# Patient Record
Sex: Female | Born: 1935
Health system: Southern US, Community
[De-identification: ages and names within clinical notes are randomized; demographics above are authoritative.]

## PROBLEM LIST (undated history)

## (undated) DIAGNOSIS — K5790 Diverticulosis of intestine, part unspecified, without perforation or abscess without bleeding: Secondary | ICD-10-CM

## (undated) DIAGNOSIS — F0390 Unspecified dementia without behavioral disturbance: Secondary | ICD-10-CM

## (undated) DIAGNOSIS — I10 Essential (primary) hypertension: Secondary | ICD-10-CM

## (undated) DIAGNOSIS — F419 Anxiety disorder, unspecified: Secondary | ICD-10-CM

## (undated) DIAGNOSIS — M1990 Unspecified osteoarthritis, unspecified site: Secondary | ICD-10-CM

## (undated) HISTORY — DX: Essential (primary) hypertension: I10

## (undated) HISTORY — DX: Diverticulosis of intestine, part unspecified, without perforation or abscess without bleeding: K57.90

## (undated) HISTORY — PX: ABDOMINAL SURGERY: SHX537

## (undated) HISTORY — DX: Anxiety disorder, unspecified: F41.9

## (undated) HISTORY — DX: Unspecified dementia, unspecified severity, without behavioral disturbance, psychotic disturbance, mood disturbance, and anxiety: F03.90

## (undated) HISTORY — DX: Unspecified osteoarthritis, unspecified site: M19.90

## (undated) HISTORY — PX: ABDOMINAL HYSTERECTOMY: SHX81

---

## 2001-03-09 ENCOUNTER — Encounter: Payer: Self-pay | Admitting: Internal Medicine

## 2001-03-09 ENCOUNTER — Inpatient Hospital Stay (HOSPITAL_COMMUNITY): Admission: EM | Admit: 2001-03-09 | Discharge: 2001-03-14 | Payer: Self-pay | Admitting: Emergency Medicine

## 2001-03-09 ENCOUNTER — Emergency Department (HOSPITAL_COMMUNITY): Admission: EM | Admit: 2001-03-09 | Discharge: 2001-03-09 | Payer: Self-pay | Admitting: Internal Medicine

## 2001-03-10 ENCOUNTER — Encounter: Payer: Self-pay | Admitting: Internal Medicine

## 2001-05-13 ENCOUNTER — Ambulatory Visit (HOSPITAL_COMMUNITY): Admission: RE | Admit: 2001-05-13 | Discharge: 2001-05-13 | Payer: Self-pay | Admitting: Ophthalmology

## 2002-05-26 ENCOUNTER — Encounter: Payer: Self-pay | Admitting: Family Medicine

## 2002-05-26 ENCOUNTER — Inpatient Hospital Stay (HOSPITAL_COMMUNITY): Admission: RE | Admit: 2002-05-26 | Discharge: 2002-06-03 | Payer: Self-pay | Admitting: Family Medicine

## 2002-05-28 ENCOUNTER — Encounter: Payer: Self-pay | Admitting: General Surgery

## 2002-09-26 ENCOUNTER — Encounter: Payer: Self-pay | Admitting: Emergency Medicine

## 2002-09-26 ENCOUNTER — Inpatient Hospital Stay (HOSPITAL_COMMUNITY): Admission: EM | Admit: 2002-09-26 | Discharge: 2002-09-28 | Payer: Self-pay | Admitting: Emergency Medicine

## 2002-09-28 ENCOUNTER — Encounter: Payer: Self-pay | Admitting: General Surgery

## 2004-02-02 ENCOUNTER — Emergency Department (HOSPITAL_COMMUNITY): Admission: EM | Admit: 2004-02-02 | Discharge: 2004-02-02 | Payer: Self-pay | Admitting: Emergency Medicine

## 2004-05-19 ENCOUNTER — Emergency Department (HOSPITAL_COMMUNITY): Admission: EM | Admit: 2004-05-19 | Discharge: 2004-05-19 | Payer: Self-pay | Admitting: Emergency Medicine

## 2004-09-25 ENCOUNTER — Ambulatory Visit: Payer: Self-pay | Admitting: Family Medicine

## 2004-12-26 ENCOUNTER — Ambulatory Visit: Payer: Self-pay | Admitting: Family Medicine

## 2005-02-06 ENCOUNTER — Ambulatory Visit: Payer: Self-pay | Admitting: Family Medicine

## 2005-04-10 ENCOUNTER — Ambulatory Visit: Payer: Self-pay | Admitting: Family Medicine

## 2005-06-08 ENCOUNTER — Ambulatory Visit: Payer: Self-pay | Admitting: Family Medicine

## 2005-07-27 ENCOUNTER — Ambulatory Visit: Payer: Self-pay | Admitting: Family Medicine

## 2005-09-18 ENCOUNTER — Ambulatory Visit: Payer: Self-pay | Admitting: Family Medicine

## 2005-10-31 ENCOUNTER — Ambulatory Visit: Payer: Self-pay | Admitting: Family Medicine

## 2005-12-11 ENCOUNTER — Ambulatory Visit: Payer: Self-pay | Admitting: Family Medicine

## 2006-01-09 ENCOUNTER — Ambulatory Visit: Payer: Self-pay | Admitting: Family Medicine

## 2006-02-25 ENCOUNTER — Ambulatory Visit: Payer: Self-pay | Admitting: Family Medicine

## 2006-04-26 ENCOUNTER — Ambulatory Visit: Payer: Self-pay | Admitting: Family Medicine

## 2006-05-05 ENCOUNTER — Inpatient Hospital Stay (HOSPITAL_COMMUNITY): Admission: EM | Admit: 2006-05-05 | Discharge: 2006-05-14 | Payer: Self-pay | Admitting: Emergency Medicine

## 2006-05-30 ENCOUNTER — Ambulatory Visit: Payer: Self-pay | Admitting: Family Medicine

## 2006-07-04 ENCOUNTER — Ambulatory Visit: Payer: Self-pay | Admitting: Family Medicine

## 2006-07-29 ENCOUNTER — Ambulatory Visit: Payer: Self-pay | Admitting: Family Medicine

## 2006-08-15 ENCOUNTER — Ambulatory Visit: Payer: Self-pay | Admitting: Family Medicine

## 2006-08-26 ENCOUNTER — Ambulatory Visit: Payer: Self-pay | Admitting: Family Medicine

## 2006-10-02 ENCOUNTER — Inpatient Hospital Stay (HOSPITAL_COMMUNITY): Admission: EM | Admit: 2006-10-02 | Discharge: 2006-10-04 | Payer: Self-pay | Admitting: Emergency Medicine

## 2006-10-02 ENCOUNTER — Ambulatory Visit: Payer: Self-pay | Admitting: Internal Medicine

## 2006-10-10 ENCOUNTER — Ambulatory Visit: Payer: Self-pay | Admitting: Family Medicine

## 2006-10-15 ENCOUNTER — Ambulatory Visit: Payer: Self-pay | Admitting: Family Medicine

## 2006-11-15 ENCOUNTER — Ambulatory Visit: Payer: Self-pay | Admitting: Family Medicine

## 2006-12-06 ENCOUNTER — Ambulatory Visit: Payer: Self-pay | Admitting: Family Medicine

## 2006-12-06 LAB — CONVERTED CEMR LAB
AST: 52 units/L — ABNORMAL HIGH (ref 0–37)
Albumin: 4.2 g/dL (ref 3.5–5.2)
Alkaline Phosphatase: 130 units/L — ABNORMAL HIGH (ref 39–117)
BUN: 27 mg/dL — ABNORMAL HIGH (ref 6–23)
Basophils Relative: 1 % (ref 0–1)
Eosinophils Absolute: 0 10*3/uL (ref 0.0–0.7)
Eosinophils Relative: 0 % (ref 0–5)
Glucose, Bld: 92 mg/dL (ref 70–99)
HCT: 32.5 % — ABNORMAL LOW (ref 36.0–46.0)
Lymphs Abs: 1.5 10*3/uL (ref 0.7–3.3)
MCHC: 32.6 g/dL (ref 30.0–36.0)
MCV: 76.8 fL — ABNORMAL LOW (ref 78.0–100.0)
Neutrophils Relative %: 61 % (ref 43–77)
Platelets: 253 10*3/uL (ref 150–400)
Potassium: 5.1 meq/L (ref 3.5–5.3)
Sodium: 144 meq/L (ref 135–145)
Total Bilirubin: 0.9 mg/dL (ref 0.3–1.2)

## 2006-12-09 ENCOUNTER — Encounter: Payer: Self-pay | Admitting: Family Medicine

## 2006-12-09 LAB — CONVERTED CEMR LAB
ALT: 42 units/L — ABNORMAL HIGH (ref 0–35)
AST: 54 units/L — ABNORMAL HIGH (ref 0–37)
Bilirubin, Direct: 0.1 mg/dL (ref 0.0–0.3)
Indirect Bilirubin: 0.7 mg/dL (ref 0.0–0.9)
Total Protein: 7.1 g/dL (ref 6.0–8.3)

## 2006-12-27 ENCOUNTER — Inpatient Hospital Stay (HOSPITAL_COMMUNITY): Admission: EM | Admit: 2006-12-27 | Discharge: 2007-01-01 | Payer: Self-pay | Admitting: Emergency Medicine

## 2007-01-15 ENCOUNTER — Ambulatory Visit: Payer: Self-pay | Admitting: Family Medicine

## 2007-03-03 ENCOUNTER — Ambulatory Visit: Payer: Self-pay | Admitting: Family Medicine

## 2007-03-06 ENCOUNTER — Ambulatory Visit: Payer: Self-pay | Admitting: Family Medicine

## 2007-04-25 ENCOUNTER — Emergency Department (HOSPITAL_COMMUNITY): Admission: EM | Admit: 2007-04-25 | Discharge: 2007-04-25 | Payer: Self-pay | Admitting: Emergency Medicine

## 2007-05-27 ENCOUNTER — Ambulatory Visit: Payer: Self-pay | Admitting: Family Medicine

## 2007-05-27 LAB — CONVERTED CEMR LAB
Basophils Absolute: 0 10*3/uL (ref 0.0–0.1)
Basophils Relative: 1 % (ref 0–1)
CO2: 24 meq/L (ref 19–32)
Calcium: 9.4 mg/dL (ref 8.4–10.5)
Cholesterol: 194 mg/dL (ref 0–200)
Creatinine, Ser: 1.42 mg/dL — ABNORMAL HIGH (ref 0.40–1.20)
Eosinophils Absolute: 0.1 10*3/uL (ref 0.0–0.7)
Eosinophils Relative: 1 % (ref 0–5)
HCT: 34.7 % — ABNORMAL LOW (ref 36.0–46.0)
HDL: 71 mg/dL (ref >39)
Hemoglobin: 11.4 g/dL — ABNORMAL LOW (ref 12.0–15.0)
Iron: 63 ug/dL (ref 42–145)
Lymphocytes Relative: 29 % (ref 12–46)
MCHC: 32.9 g/dL (ref 30.0–36.0)
MCV: 78.2 fL (ref 78.0–100.0)
Monocytes Absolute: 0.3 10*3/uL (ref 0.2–0.7)
Platelets: 224 10*3/uL (ref 150–400)
RDW: 17.4 % — ABNORMAL HIGH (ref 11.5–14.0)
Retic Count, Absolute: 35.5 (ref 19.0–186.0)
Retic Ct Pct: 0.8 % (ref 0.4–3.1)
Saturation Ratios: 16 % — ABNORMAL LOW (ref 20–55)
TIBC: 387 ug/dL (ref 250–470)
UIBC: 324 ug/dL

## 2007-06-19 ENCOUNTER — Ambulatory Visit: Payer: Self-pay | Admitting: Family Medicine

## 2007-07-17 ENCOUNTER — Ambulatory Visit: Payer: Self-pay | Admitting: Family Medicine

## 2007-08-14 ENCOUNTER — Ambulatory Visit: Payer: Self-pay | Admitting: Family Medicine

## 2007-09-04 ENCOUNTER — Emergency Department (HOSPITAL_COMMUNITY): Admission: EM | Admit: 2007-09-04 | Discharge: 2007-09-04 | Payer: Self-pay | Admitting: Emergency Medicine

## 2007-09-12 ENCOUNTER — Ambulatory Visit: Payer: Self-pay | Admitting: Family Medicine

## 2007-09-12 ENCOUNTER — Observation Stay (HOSPITAL_COMMUNITY): Admission: EM | Admit: 2007-09-12 | Discharge: 2007-09-13 | Payer: Self-pay | Admitting: Emergency Medicine

## 2007-09-19 ENCOUNTER — Ambulatory Visit: Payer: Self-pay | Admitting: Family Medicine

## 2007-09-25 ENCOUNTER — Encounter: Payer: Self-pay | Admitting: Family Medicine

## 2007-10-16 ENCOUNTER — Ambulatory Visit: Payer: Self-pay | Admitting: Family Medicine

## 2007-10-31 ENCOUNTER — Ambulatory Visit: Payer: Self-pay | Admitting: Family Medicine

## 2007-11-13 ENCOUNTER — Encounter: Payer: Self-pay | Admitting: Family Medicine

## 2007-12-15 ENCOUNTER — Ambulatory Visit: Payer: Self-pay | Admitting: Family Medicine

## 2007-12-16 ENCOUNTER — Ambulatory Visit: Payer: Self-pay | Admitting: Family Medicine

## 2008-01-30 ENCOUNTER — Ambulatory Visit: Payer: Self-pay | Admitting: Family Medicine

## 2008-01-30 LAB — CONVERTED CEMR LAB
BUN: 27 mg/dL — ABNORMAL HIGH (ref 6–23)
Basophils Relative: 1 % (ref 0–1)
Calcium: 9.1 mg/dL (ref 8.4–10.5)
Eosinophils Absolute: 0.1 10*3/uL (ref 0.0–0.7)
Eosinophils Relative: 1 % (ref 0–5)
Glucose, Bld: 101 mg/dL — ABNORMAL HIGH (ref 70–99)
HCT: 37.8 % (ref 36.0–46.0)
Lymphs Abs: 1.1 10*3/uL (ref 0.7–4.0)
MCHC: 35.2 g/dL (ref 30.0–36.0)
MCV: 86.5 fL (ref 78.0–100.0)
Monocytes Relative: 8 % (ref 3–12)
Platelets: 169 10*3/uL (ref 150–400)
Sodium: 142 meq/L (ref 135–145)
WBC: 5.4 10*3/uL (ref 4.0–10.5)

## 2008-02-03 ENCOUNTER — Encounter: Payer: Self-pay | Admitting: Family Medicine

## 2008-02-03 LAB — CONVERTED CEMR LAB: Uric Acid, Serum: 7.5 mg/dL — ABNORMAL HIGH (ref 2.4–7.0)

## 2008-02-06 ENCOUNTER — Ambulatory Visit: Payer: Self-pay | Admitting: Family Medicine

## 2008-03-10 DIAGNOSIS — F0390 Unspecified dementia without behavioral disturbance: Secondary | ICD-10-CM

## 2008-03-10 DIAGNOSIS — F419 Anxiety disorder, unspecified: Secondary | ICD-10-CM

## 2008-03-10 DIAGNOSIS — K5730 Diverticulosis of large intestine without perforation or abscess without bleeding: Secondary | ICD-10-CM

## 2008-03-10 DIAGNOSIS — M479 Spondylosis, unspecified: Secondary | ICD-10-CM | POA: Insufficient documentation

## 2008-03-10 DIAGNOSIS — I10 Essential (primary) hypertension: Secondary | ICD-10-CM

## 2008-03-11 ENCOUNTER — Ambulatory Visit: Payer: Self-pay | Admitting: Family Medicine

## 2008-04-14 ENCOUNTER — Ambulatory Visit: Payer: Self-pay | Admitting: Family Medicine

## 2008-05-06 ENCOUNTER — Ambulatory Visit: Payer: Self-pay | Admitting: Family Medicine

## 2008-06-10 ENCOUNTER — Ambulatory Visit: Payer: Self-pay | Admitting: Family Medicine

## 2008-08-13 ENCOUNTER — Ambulatory Visit: Payer: Self-pay | Admitting: Family Medicine

## 2008-08-16 ENCOUNTER — Ambulatory Visit: Payer: Self-pay | Admitting: Family Medicine

## 2008-08-27 ENCOUNTER — Encounter: Payer: Self-pay | Admitting: Family Medicine

## 2008-08-30 ENCOUNTER — Encounter: Payer: Self-pay | Admitting: Family Medicine

## 2008-08-31 ENCOUNTER — Encounter: Payer: Self-pay | Admitting: Family Medicine

## 2008-09-04 ENCOUNTER — Telehealth (INDEPENDENT_AMBULATORY_CARE_PROVIDER_SITE_OTHER): Payer: Self-pay | Admitting: Internal Medicine

## 2008-09-27 ENCOUNTER — Encounter: Payer: Self-pay | Admitting: Family Medicine

## 2008-09-29 ENCOUNTER — Ambulatory Visit: Payer: Self-pay | Admitting: Family Medicine

## 2008-10-15 ENCOUNTER — Encounter: Payer: Self-pay | Admitting: Family Medicine

## 2008-10-20 ENCOUNTER — Telehealth: Payer: Self-pay | Admitting: Family Medicine

## 2008-10-21 ENCOUNTER — Encounter: Payer: Self-pay | Admitting: Family Medicine

## 2008-10-26 ENCOUNTER — Ambulatory Visit: Payer: Self-pay | Admitting: Family Medicine

## 2008-10-27 DIAGNOSIS — N750 Cyst of Bartholin's gland: Secondary | ICD-10-CM

## 2008-11-10 ENCOUNTER — Telehealth: Payer: Self-pay | Admitting: Family Medicine

## 2008-11-10 ENCOUNTER — Encounter: Payer: Self-pay | Admitting: Family Medicine

## 2008-12-15 ENCOUNTER — Encounter: Payer: Self-pay | Admitting: Family Medicine

## 2008-12-16 ENCOUNTER — Encounter: Payer: Self-pay | Admitting: Family Medicine

## 2008-12-23 ENCOUNTER — Encounter: Payer: Self-pay | Admitting: Family Medicine

## 2008-12-29 ENCOUNTER — Telehealth: Payer: Self-pay | Admitting: Family Medicine

## 2009-01-03 ENCOUNTER — Ambulatory Visit: Payer: Self-pay | Admitting: Family Medicine

## 2009-01-03 LAB — CONVERTED CEMR LAB
Basophils Absolute: 0 10*3/uL (ref 0.0–0.1)
Basophils Relative: 0 % (ref 0–1)
Chloride: 105 meq/L (ref 96–112)
Creatinine, Ser: 1.51 mg/dL — ABNORMAL HIGH (ref 0.40–1.20)
HDL: 56 mg/dL (ref >39)
LDL Cholesterol: 109 mg/dL — ABNORMAL HIGH (ref 0–99)
MCHC: 33.4 g/dL (ref 30.0–36.0)
Neutro Abs: 3.1 10*3/uL (ref 1.7–7.7)
Neutrophils Relative %: 63 % (ref 43–77)
Platelets: 254 10*3/uL (ref 150–400)
Potassium: 4.5 meq/L (ref 3.5–5.3)
RDW: 14.1 % (ref 11.5–15.5)
Sodium: 143 meq/L (ref 135–145)
Triglycerides: 94 mg/dL (ref <150)
VLDL: 19 mg/dL (ref 0–40)

## 2009-03-01 ENCOUNTER — Encounter: Payer: Self-pay | Admitting: Family Medicine

## 2009-03-04 ENCOUNTER — Encounter: Payer: Self-pay | Admitting: Family Medicine

## 2009-03-07 ENCOUNTER — Ambulatory Visit: Payer: Self-pay | Admitting: Family Medicine

## 2009-04-05 ENCOUNTER — Encounter: Payer: Self-pay | Admitting: Family Medicine

## 2009-04-26 ENCOUNTER — Encounter: Payer: Self-pay | Admitting: Family Medicine

## 2009-05-06 ENCOUNTER — Encounter: Payer: Self-pay | Admitting: Family Medicine

## 2009-05-09 ENCOUNTER — Ambulatory Visit: Payer: Self-pay | Admitting: Family Medicine

## 2009-05-09 ENCOUNTER — Telehealth: Payer: Self-pay | Admitting: Family Medicine

## 2009-05-09 DIAGNOSIS — B351 Tinea unguium: Secondary | ICD-10-CM | POA: Insufficient documentation

## 2009-05-11 ENCOUNTER — Encounter: Payer: Self-pay | Admitting: Family Medicine

## 2009-06-06 ENCOUNTER — Encounter: Payer: Self-pay | Admitting: Family Medicine

## 2009-06-13 ENCOUNTER — Encounter: Payer: Self-pay | Admitting: Family Medicine

## 2009-06-20 ENCOUNTER — Encounter: Payer: Self-pay | Admitting: Family Medicine

## 2009-08-02 ENCOUNTER — Encounter: Payer: Self-pay | Admitting: Family Medicine

## 2009-08-17 ENCOUNTER — Ambulatory Visit: Payer: Self-pay | Admitting: Family Medicine

## 2009-09-01 ENCOUNTER — Encounter: Payer: Self-pay | Admitting: Family Medicine

## 2009-09-09 ENCOUNTER — Telehealth: Payer: Self-pay | Admitting: Family Medicine

## 2009-10-11 ENCOUNTER — Encounter: Payer: Self-pay | Admitting: Family Medicine

## 2009-10-19 ENCOUNTER — Encounter: Payer: Self-pay | Admitting: Family Medicine

## 2009-10-21 ENCOUNTER — Encounter: Payer: Self-pay | Admitting: Family Medicine

## 2009-12-06 ENCOUNTER — Ambulatory Visit: Payer: Self-pay | Admitting: Family Medicine

## 2009-12-21 ENCOUNTER — Ambulatory Visit: Payer: Self-pay | Admitting: Family Medicine

## 2009-12-21 LAB — CONVERTED CEMR LAB
BUN: 27 mg/dL — ABNORMAL HIGH (ref 6–23)
Basophils Absolute: 0 10*3/uL (ref 0.0–0.1)
Calcium: 9.5 mg/dL (ref 8.4–10.5)
Cholesterol: 221 mg/dL — ABNORMAL HIGH (ref 0–200)
Creatinine, Ser: 1.31 mg/dL — ABNORMAL HIGH (ref 0.40–1.20)
Eosinophils Relative: 2 % (ref 0–5)
HCT: 40.8 % (ref 36.0–46.0)
Lymphocytes Relative: 27 % (ref 12–46)
Platelets: 177 10*3/uL (ref 150–400)
Potassium: 4.7 meq/L (ref 3.5–5.3)
RDW: 14.6 % (ref 11.5–15.5)
Triglycerides: 81 mg/dL (ref <150)
VLDL: 16 mg/dL (ref 0–40)

## 2010-01-03 ENCOUNTER — Encounter: Payer: Self-pay | Admitting: Family Medicine

## 2010-01-05 ENCOUNTER — Ambulatory Visit: Payer: Self-pay | Admitting: Family Medicine

## 2010-01-05 ENCOUNTER — Telehealth: Payer: Self-pay | Admitting: Family Medicine

## 2010-01-26 ENCOUNTER — Telehealth: Payer: Self-pay | Admitting: Family Medicine

## 2010-01-31 ENCOUNTER — Ambulatory Visit: Payer: Self-pay | Admitting: Family Medicine

## 2010-01-31 DIAGNOSIS — R42 Dizziness and giddiness: Secondary | ICD-10-CM

## 2010-02-06 ENCOUNTER — Encounter: Payer: Self-pay | Admitting: Family Medicine

## 2010-02-08 ENCOUNTER — Encounter: Payer: Self-pay | Admitting: Family Medicine

## 2010-02-20 ENCOUNTER — Encounter: Payer: Self-pay | Admitting: Family Medicine

## 2010-02-24 ENCOUNTER — Encounter: Payer: Self-pay | Admitting: Family Medicine

## 2010-04-06 ENCOUNTER — Encounter: Payer: Self-pay | Admitting: Family Medicine

## 2010-04-26 ENCOUNTER — Ambulatory Visit: Payer: Self-pay | Admitting: Family Medicine

## 2010-04-26 DIAGNOSIS — R634 Abnormal weight loss: Secondary | ICD-10-CM

## 2010-05-09 ENCOUNTER — Encounter: Payer: Self-pay | Admitting: Family Medicine

## 2010-05-09 ENCOUNTER — Telehealth: Payer: Self-pay | Admitting: Family Medicine

## 2010-05-29 ENCOUNTER — Ambulatory Visit: Payer: Self-pay | Admitting: Family Medicine

## 2010-06-02 ENCOUNTER — Ambulatory Visit: Payer: Self-pay | Admitting: Family Medicine

## 2010-06-13 ENCOUNTER — Encounter: Payer: Self-pay | Admitting: Family Medicine

## 2010-07-05 ENCOUNTER — Telehealth: Payer: Self-pay | Admitting: Family Medicine

## 2010-08-01 ENCOUNTER — Ambulatory Visit: Payer: Self-pay | Admitting: Family Medicine

## 2010-08-14 ENCOUNTER — Encounter: Payer: Self-pay | Admitting: Family Medicine

## 2010-08-18 ENCOUNTER — Telehealth: Payer: Self-pay | Admitting: Family Medicine

## 2010-09-18 ENCOUNTER — Telehealth: Payer: Self-pay | Admitting: Family Medicine

## 2010-09-21 ENCOUNTER — Ambulatory Visit: Payer: Self-pay | Admitting: Family Medicine

## 2010-10-10 ENCOUNTER — Ambulatory Visit: Payer: Self-pay | Admitting: Family Medicine

## 2010-11-16 ENCOUNTER — Telehealth: Payer: Self-pay | Admitting: Family Medicine

## 2010-11-29 ENCOUNTER — Encounter: Payer: Self-pay | Admitting: Family Medicine

## 2010-12-12 NOTE — Letter (Signed)
Summary: progress notes  progress notes   Imported By: Curtis Sites 04/13/2010 12:05:49  _____________________________________________________________________  External Attachment:    Type:   Image     Comment:   External Document

## 2010-12-12 NOTE — Letter (Signed)
Summary: history and physical  history and physical   Imported By: Curtis Sites 04/13/2010 12:06:55  _____________________________________________________________________  External Attachment:    Type:   Image     Comment:   External Document

## 2010-12-12 NOTE — Assessment & Plan Note (Signed)
Summary: recert   Allergies: No Known Drug Allergies   Complete Medication List: 1)  Stool Softener 100 Mg Caps (Docusate sodium) .... One cap by mouth two times a day 2)  Amlodipine Besy-benazepril Hcl 10-20 Mg Caps (Amlodipine besy-benazepril hcl) .... One cap by mouth once daily 3)  Abc Plus Tabs (Multiple vitamins-minerals) .... One tab by mouth once daily 4)  Ted Hose (knee High)  5)  [employer] (Misc. devices) .... Uad 6)  Benicar Hct 40-25 Mg Tabs (Olmesartan medoxomil-hctz) .... Take 1 tablet by mouth once a day 7)  Alprazolam 0.25 Mg Tabs (Alprazolam) .... May take one at bedtime as needed for anxiety 8)  Ferocon Caps (Fe fumarate-b12-vit c-fa-ifc) .... Take 1 tablet by mouth once a day  Other Orders: Re-certification of Home Health [phone])

## 2010-12-12 NOTE — Assessment & Plan Note (Signed)
Summary: office visit   Vital Signs:  Patient profile:   77 year old female Menstrual status:  hysterectomy Height:      62 inches O2 Sat:      98 % on Room air Pulse rate:   66 / minute Pulse rhythm:   regular Resp:     16 per minute BP sitting:   158 / 70  (left arm) Cuff size:   regular  Vitals Entered By: Everitt Amber LPN (May 29, 2010 9:41 AM)  O2 Flow:  Room air CC: Follow up chronic problems   Primary Care Provider:  Syliva Overman MD  CC:  Follow up chronic problems.  History of Present Illness: Reports  that she has been  doing fairly well. Denies recent fever or chills. Denies sinus pressure, nasal congestion , ear pain or sore throat. Denies chest congestion, or cough productive of sputum. Denies chest pain, palpitations, PND, orthopnea or leg swelling. Denies abdominal pain, nausea, vomitting, diarrhea or constipation.she had a recent episode of rectal bleeding , however she remained stable and was not hospitalised, review of the labs shows nl hemoglobin Denies change in bowel movements  Denies dysuria , frequency, incontinence or hesitancy. chronic joint pain and stiffness with reduced mobility.Wheelchair dependent Denies headaches, vertigo, seizures. Denies depression, anxiety or insomnia.She takes med for anxiety and insomnia which controls her symptoms. Denies  rash, lesions, or itch.     Current Medications (verified): 1)  Stool Softener 100 Mg  Caps (Docusate Sodium) .... One Cap By Mouth Two Times A Day 2)  Amlodipine Besy-Benazepril Hcl 10-20 Mg  Caps (Amlodipine Besy-Benazepril Hcl) .... One Cap By Mouth Once Daily 3)  Abc Plus   Tabs (Multiple Vitamins-Minerals) .... One Tab By Mouth Once Daily 4)  Ted Hose (Knee High) 5)  Geophysical data processor (Misc. Devices) .... Uad 6)  Diovan Hct 320-25 Mg Tabs (Valsartan-Hydrochlorothiazide) .... Take 1 Daily 7)  Alprazolam 0.25 Mg Tabs (Alprazolam) .... May Take One At Bedtime As Needed For Anxiety 8)  Ferocon   Caps (Fe Fumarate-B12-Vit C-Fa-Ifc) .... Take 1 Tablet By Mouth Once A Day  Allergies (verified): No Known Drug Allergies  Review of Systems      See HPI General:  Complains of fatigue. Eyes:  Complains of vision loss-both eyes. Heme:  Denies abnormal bruising and bleeding. Allergy:  Denies hives or rash, itching eyes, and seasonal allergies.  Physical Exam  General:  Well-developed,well-nourished,in no acute distress; alert,appropriate and cooperative throughout examination HEENT: No facial asymmetry,  EOMI, No sinus tenderness, TM's Clear, oropharynx  pink and moist.   Chest: Clear to auscultation bilaterally.  CVS: S1, S2, No murmurs, No S3.   Abd: Soft, Nontender.  JY:[id number]  ROM spine, hips, shoulders and knees.  Ext: No edema.   CNS: CN 2-12 intact, power tone and sensation normal throughout.   Skin: Intact, no visible lesions or rashes.  Psych: Good eye contact, normal affect.  Memory loss, not anxious or depressed appearing.    Impression & Recommendations:  Problem # 1:  DIZZINESS (ICD-780.4) Assessment Improved  Problem # 2:  DEMENTIA (ICD-294.8) Assessment: Unchanged  Problem # 3:  DEGENERATIVE JOINT DISEASE, SPINE (ICD-721.90) Assessment: Unchanged  Problem # 4:  HYPERTENSION (ICD-401.9) Assessment: Unchanged  Her updated medication list for this problem includes:    Amlodipine Besy-benazepril Hcl 10-20 Mg Caps (Amlodipine besy-benazepril hcl) ..... One cap by mouth once daily    Diovan Hct 320-25 Mg Tabs (Valsartan-hydrochlorothiazide) .Marland Kitchen... Take 1 daily  BP  today: 158/70 Prior BP: 162/92 (04/26/2010)  Labs Reviewed: K+: 4.7 (12/21/2009) Creat: : 1.31 (12/21/2009)   Chol: 221 (12/21/2009)   HDL: 76 (12/21/2009)   LDL: 129 (12/21/2009)   TG: 81 (12/21/2009)  Complete Medication List: 1)  Stool Softener 100 Mg Caps (Docusate sodium) .... One cap by mouth two times a day 2)  Amlodipine Besy-benazepril Hcl 10-20 Mg Caps (Amlodipine besy-benazepril  hcl) .... One cap by mouth once daily 3)  Abc Plus Tabs (Multiple vitamins-minerals) .... One tab by mouth once daily 4)  Ted Hose (knee High)  5)  [employer] (Misc. devices) .... Uad 6)  Diovan Hct 320-25 Mg Tabs (Valsartan-hydrochlorothiazide) .... Take 1 daily 7)  Alprazolam 0.25 Mg Tabs (Alprazolam) .... May take one at bedtime as needed for anxiety 8)  Ferocon Caps (Fe fumarate-b12-vit c-fa-ifc) .... Take 1 tablet by mouth once a day  Patient Instructions: 1)  Please schedule a follow-up appointment in 3.5 months. 2)  I hope you continue to do very well. 3)  No med changes at this time

## 2010-12-12 NOTE — Progress Notes (Signed)
  Phone Note Call from Patient   Caller: lydia Summary of Call: lydia called states patient complains of shortness of breath, upper and lower lungs didnt sound like adequate airflow, no wheezing, all vitals fine, no fever, doesnt seem to be in respiratory distress but lungs didnt sound good, was advised she needed to be checked out by physician, patient states she didnt have transportation and refused ambulance transport. she did say she would have someone take her to er later. Initial call taken by: Adella Hare LPN,  January 05, 2010 4:09 PM  Follow-up for Phone Call        noted, I agree with recommendation by nursing and note pt's refusal to seek immediate eval, unable to force her to do so, hopefully she will be evaluate  later as planned Follow-up by: Syliva Overman MD,  January 05, 2010 8:35 PM

## 2010-12-12 NOTE — Miscellaneous (Signed)
Summary: Home Care Report  Home Care Report   Imported By: Lind Guest 06/02/2010 15:16:43  _____________________________________________________________________  External Attachment:    Type:   Image     Comment:   External Document

## 2010-12-12 NOTE — Letter (Signed)
Summary: demographic  demographic   Imported By: Curtis Sites 04/13/2010 12:11:46  _____________________________________________________________________  External Attachment:    Type:   Image     Comment:   External Document

## 2010-12-12 NOTE — Assessment & Plan Note (Signed)
Summary: office visit   Vital Signs:  Patient profile:   77 year old female Menstrual status:  hysterectomy Pulse rate:   66 / minute Pulse rhythm:   regular BP sitting:   148 / 84  (left arm)  Vitals Entered By: Mauricia Area CMA (September 21, 2010 11:02 AM) 1CC: follow up   Primary Care Provider:  Syliva Overman MD  CC:  follow up.  History of Present Illness: Reports  that she is doing verywell. States she is not sick, and she just celebrated her 69 birthday.l. Denies recent fever or chills. Denies sinus pressure, nasal congestion , ear pain or sore throat. Denies chest congestion, or cough productive of sputum. Denies chest pain, palpitations, PND, orthopnea or leg swelling. Denies abdominal pain, nausea, vomitting, diarrhea or constipation. Denies change in bowel movements or bloody stool. Denies dysuria , frequency, incontinence or hesitancy. chronic   joint pain, swelling, and reduced mobility. Denies headaches, vertigo, seizures. Denies depression, anxiety or insomnia. Denies  rash, lesions, or itch.     Current Medications (verified): 1)  Stool Softener 100 Mg  Caps (Docusate Sodium) .... One Cap By Mouth Two Times A Day 2)  Amlodipine Besy-Benazepril Hcl 10-20 Mg  Caps (Amlodipine Besy-Benazepril Hcl) .... One Cap By Mouth Once Daily 3)  Abc Plus   Tabs (Multiple Vitamins-Minerals) .... One Tab By Mouth Once Daily 4)  Ted Hose (Knee High) 5)  Geophysical data processor (Misc. Devices) .... Uad 6)  Ferocon  Caps (Fe Fumarate-B12-Vit C-Fa-Ifc) .... Take 1 Tablet By Mouth Once A Day  Allergies (verified): No Known Drug Allergies  Review of Systems      See HPI General:  Complains of fatigue and sleep disorder. Eyes:  Complains of vision loss-both eyes; denies discharge and red eye. Neuro:  Complains of falling down, memory loss, visual disturbances, and weakness. Endo:  Denies cold intolerance, excessive thirst, excessive urination, and heat intolerance. Heme:   Denies abnormal bruising and bleeding. Allergy:  Denies hives or rash and itching eyes.  Physical Exam  General:  Well-developed,well-nourished,in no acute distress; alert,appropriate and cooperative throughout examination HEENT: No facial asymmetry,  EOMI, No sinus tenderness, TM's Clear, oropharynx  pink and moist.   Chest: Clear to auscultation bilaterally.  CVS: S1, S2, No murmurs, No S3.   Abd: Soft, Nontender.  WJ:[id number]  ROM spine, hips, shoulders and knees.  Ext: No edema.   CNS: CN 2-12 intact, power tone and sensation normal throughout.   Skin: Intact, no visible lesions or rashes.  Psych: Good eye contact, normal affect.  Memory loss, not anxious or depressed appearing.    Impression & Recommendations:  Problem # 1:  ANXIETY (ICD-300.00) Assessment Improved  The following medications were removed from the medication list:    Alprazolam 0.25 Mg Tabs (Alprazolam) ..... May take one at bedtime as needed for anxiety  Problem # 2:  DEGENERATIVE JOINT DISEASE, SPINE (ICD-721.90) Assessment: Unchanged  Problem # 3:  HYPERTENSION (ICD-401.9) Assessment: Unchanged  The following medications were removed from the medication list:    Benicar Hct 40-25 Mg Tabs (Olmesartan medoxomil-hctz) .Marland Kitchen... Take 1 tablet by mouth once a day Her updated medication list for this problem includes:    Amlodipine Besy-benazepril Hcl 10-20 Mg Caps (Amlodipine besy-benazepril hcl) ..... One cap by mouth once daily  Orders: Medicare Electronic Prescription [phone])  BP today: 148/84 Prior BP: 158/70 (05/29/2010)  Labs Reviewed: K+: 4.7 (12/21/2009) Creat: : 1.31 (12/21/2009)   Chol: 221 (12/21/2009)   HDL:  76 (12/21/2009)   LDL: 129 (12/21/2009)   TG: 81 (12/21/2009)  Problem # 4:  DIVERTICULAR DISEASE (ICD-562.10) Assessment: Unchanged  Complete Medication List: 1)  Stool Softener 100 Mg Caps (Docusate sodium) .... One cap by mouth two times a day 2)  Amlodipine Besy-benazepril Hcl  10-20 Mg Caps (Amlodipine besy-benazepril hcl) .... One cap by mouth once daily 3)  Abc Plus Tabs (Multiple vitamins-minerals) .... One tab by mouth once daily 4)  Ted Hose (knee High)  5)  [employer] (Misc. devices) .... Uad 6)  Ferocon Caps (Fe fumarate-b12-vit c-fa-ifc) .... Take 1 tablet by mouth once a day  Patient Instructions: 1)  Please schedule a follow-up appointment in 4.5 months. 2)  CONGRATS on your birthday 3)  no med changes 4)  merry Christmas, and thanksgiving Prescriptions: AMLODIPINE BESY-BENAZEPRIL HCL 10-20 MG  CAPS (AMLODIPINE BESY-BENAZEPRIL HCL) one cap by mouth once daily  #30 x 3   Entered by:   Mauricia Area CMA   Authorized by:   Syliva Overman MD   Signed by:   Mauricia Area CMA on 09/21/2010   Method used:   Electronically to        [facility]* (retail)       [address]       [place], [place]  [address]       Ph: [phone]       Fax: [phone]   RxID:   [id number]    Orders Added: 1)  Est. Patient Level IV [84132] 2)  Medicare Electronic Prescription [phone]

## 2010-12-12 NOTE — Progress Notes (Signed)
Summary: d/c for medication  Phone Note Call from Patient   Summary of Call: Needs a DC order to Diovan/HCT faxed to [facility]. Initial call taken by: Curtis Sites,  September 18, 2010 9:01 AM  Follow-up for Phone Call        sent as requested Follow-up by: Adella Hare LPN,  September 18, 2010 2:33 PM

## 2010-12-12 NOTE — Miscellaneous (Signed)
Summary: med change  Clinical Lists Changes  Medications: Changed medication from BENICAR HCT 40-25 MG  TABS (OLMESARTAN MEDOXOMIL-HCTZ) one tab by mouth once daily to DIOVAN HCT 320-25 MG TABS (VALSARTAN-HYDROCHLOROTHIAZIDE) Take 1 tablet by mouth once a day - Signed Rx of DIOVAN HCT 320-25 MG TABS (VALSARTAN-HYDROCHLOROTHIAZIDE) Take 1 tablet by mouth once a day;  #30 x 3;  Signed;  Entered by: Everitt Amber LPN;  Authorized by: Syliva Overman MD;  Method used: Electronically to [facility] [address] [place], [place], [place]  [address], Ph: [phone], Fax: [phone]    Prescriptions: DIOVAN HCT 320-25 MG TABS (VALSARTAN-HYDROCHLOROTHIAZIDE) Take 1 tablet by mouth once a day  #30 x 3   Entered by:   Everitt Amber LPN   Authorized by:   Syliva Overman MD   Signed by:   Everitt Amber LPN on 13/06/6577   Method used:   Electronically to        [facility] (retail)       [address]       [place], [place]  [address]       Ph: [phone]       Fax: [phone]   RxID:   [id number]

## 2010-12-12 NOTE — Miscellaneous (Signed)
Summary: Home Care Report  Home Care Report   Imported By: Lind Guest 10/10/2010 11:50:37  _____________________________________________________________________  External Attachment:    Type:   Image     Comment:   External Document

## 2010-12-12 NOTE — Progress Notes (Signed)
  Phone Note Call from Patient   Summary of Call: [name] has been having problems with the Diovan saying that it slows her down and makes her feel funny (138/64) is her BP. Wants to go back to the Benicar Initial call taken by: Everitt Amber LPN,  July 05, 2010 9:13 AM  Follow-up for Phone Call        try to see if you canpa stating pt reports bad feeling pls , let hwer know you are working on this Follow-up by: Syliva Overman MD,  July 05, 2010 11:15 AM  Additional Follow-up for Phone Call Additional follow up Details #1::        WIll let [name] know, She was the one who was inquiring about the change and that the pt wanted the old med Additional Follow-up by: Everitt Amber LPN,  July 05, 2010 1:18 PM    New/Updated Medications: BENICAR HCT 40-25 MG TABS (OLMESARTAN MEDOXOMIL-HCTZ) Take 1 tablet by mouth once a day Prescriptions: BENICAR HCT 40-25 MG TABS (OLMESARTAN MEDOXOMIL-HCTZ) Take 1 tablet by mouth once a day  #30 x 2   Entered by:   Everitt Amber LPN   Authorized by:   Syliva Overman MD   Signed by:   Everitt Amber LPN on 69/62/9528   Method used:   Printed then faxed to ...       [facility]* (retail)       [address]       [place], [place]  [address]       Ph: [phone]       Fax: [phone]   RxID:   [id number] BENICAR HCT 40-25 MG TABS (OLMESARTAN MEDOXOMIL-HCTZ) Take 1 tablet by mouth once a day  #30 x 2   Entered by:   Everitt Amber LPN   Authorized by:   Syliva Overman MD   Signed by:   Everitt Amber LPN on 84/16/6063   Method used:   Electronically to        [facility] (retail)       [address] [place], [place]  [address]       Ph: [phone]       Fax: [phone]   RxID:   [id number]

## 2010-12-12 NOTE — Miscellaneous (Signed)
Summary: Home Care Report  Home Care Report   Imported By: Lind Guest 06/19/2010 15:12:30  _____________________________________________________________________  External Attachment:    Type:   Image     Comment:   External Document

## 2010-12-12 NOTE — Progress Notes (Signed)
  Phone Note Call from Patient   Summary of Call: [name] called and said that patients BP has been running good everytime she goes out to see her and she has only been taking the lotrel. Her Beicar was never authorized but she doesn't think she needs it now. Please re-evalute her need for the benicar at the next OV on Nov 8th. If she needs it still then it needs to be authorized. She failed the preferred Diovan  Initial call taken by: Everitt Amber LPN,  August 18, 2010 2:45 PM  Follow-up for Phone Call        noted Follow-up by: Syliva Overman MD,  August 19, 2010 6:21 AM

## 2010-12-12 NOTE — Medication Information (Signed)
Summary: Tax adviser   Imported By: Lind Guest 02/08/2010 08:16:40  _____________________________________________________________________  External Attachment:    Type:   Image     Comment:   External Document

## 2010-12-12 NOTE — Miscellaneous (Signed)
Summary: Home Care Report  Home Care Report   Imported By: Lind Guest 06/20/2009 16:28:40  _____________________________________________________________________  External Attachment:    Type:   Image     Comment:   External Document

## 2010-12-12 NOTE — Letter (Signed)
Summary: phone notes  phone notes   Imported By: Curtis Sites 04/13/2010 12:01:29  _____________________________________________________________________  External Attachment:    Type:   Image     Comment:   External Document

## 2010-12-12 NOTE — Progress Notes (Signed)
Summary: rectal bleeding  Phone Note Call from Patient   Summary of Call: pt bleeding out rectum when she has a bowel movement. does she need to come in. [phone] Initial call taken by: Rudene Anda,  May 09, 2010 8:59 AM  Follow-up for Phone Call        Patient advised to call 911  she does not have a way to hospital Follow-up by: Adella Hare LPN,  May 09, 2010 9:49 AM

## 2010-12-12 NOTE — Letter (Signed)
Summary: consults  consults   Imported By: Curtis Sites 04/13/2010 11:51:22  _____________________________________________________________________  External Attachment:    Type:   Image     Comment:   External Document

## 2010-12-12 NOTE — Progress Notes (Signed)
Summary: CALL BACK  Phone Note Call from Patient   Summary of Call: WANTS TO KNOW ABOUT A SCOOTER WHEELCHAIR CALL BACK AFTER LUNCH SHE WAS GETTING READY TO LEAVE  Initial call taken by: Lind Guest,  January 26, 2010 11:08 AM  Follow-up for Phone Call        do you think it would be safe for this patient to have a scooter wheelchair? Follow-up by: Adella Hare LPN,  January 26, 2010 11:22 AM  Additional Follow-up for Phone Call Additional follow up Details #1::        I am not sure, she nes a physical therapy eval and an assesment of her home done to see if it can hiold the chair if she qualifies Additional Follow-up by: Syliva Overman MD,  February 02, 2010 9:38 AM    Additional Follow-up for Phone Call Additional follow up Details #2::    patient states she doesnt need it in the house, just one that she can get on the [employer]  states scooter not neccessary, just needs a smaller wheelchair Follow-up by: Adella Hare LPN,  February 02, 2010 2:28 PM  Additional Follow-up for Phone Call Additional follow up Details #3:: Details for Additional Follow-up Action Taken: pls check with [facility]A if her current w/chair is relatively recent she may not qualify for a smallerone, if she does let me know, then i will write Additional Follow-up by: Syliva Overman MD,  February 02, 2010 4:36 PM   sp[facility] with carol at [facility], needs script for wheelchair with dx and ht and wt, fax to [phone] attn carol  Adella Hare LPN  February 03, 2010 9:07 AM scri[pt written and in your folder

## 2010-12-12 NOTE — Miscellaneous (Signed)
Summary: Home Care Report  Home Care Report   Imported By: Lind Guest 02/24/2010 14:12:36  _____________________________________________________________________  External Attachment:    Type:   Image     Comment:   External Document

## 2010-12-12 NOTE — Miscellaneous (Signed)
Summary: Home Care Report  Home Care Report   Imported By: Lind Guest 02/06/2010 14:28:20  _____________________________________________________________________  External Attachment:    Type:   Image     Comment:   External Document

## 2010-12-12 NOTE — Assessment & Plan Note (Signed)
Summary: recert   Allergies: No Known Drug Allergies   Complete Medication List: 1)  Stool Softener 100 Mg Caps (Docusate sodium) .... One cap by mouth two times a day 2)  Amlodipine Besy-benazepril Hcl 10-20 Mg Caps (Amlodipine besy-benazepril hcl) .... One cap by mouth once daily 3)  Abc Plus Tabs (Multiple vitamins-minerals) .... One tab by mouth once daily 4)  Ted Hose (knee High)  5)  [employer] (Misc. devices) .... Uad 6)  Diovan Hct 320-25 Mg Tabs (Valsartan-hydrochlorothiazide) .... Take 1 daily 7)  Alprazolam 0.25 Mg Tabs (Alprazolam) .... May take one at bedtime as needed for anxiety 8)  Ferocon Caps (Fe fumarate-b12-vit c-fa-ifc) .... Take 1 tablet by mouth once a day  Other Orders: Re-certification of Home Health [phone])

## 2010-12-12 NOTE — Assessment & Plan Note (Signed)
Summary: office visit   Vital Signs:  Patient profile:   77 year old female Menstrual status:  hysterectomy Height:      62 inches Weight:      107.75 pounds Pulse rate:   64 / minute Pulse rhythm:   regular Resp:     16 per minute BP sitting:   120 / 70  (left arm)  Vitals Entered By: Worthy Keeler LPN (December 21, 2009 10:08 AM) CC: follow-up visit Is Patient Diabetic? No Pain Assessment Patient in pain? no        Primary Care Provider:  Syliva Overman MD  CC:  follow-up visit.  History of Present Illness: Reports  thatshe has been doing fairly well. Denies recent fever or chills. Denies sinus pressure, nasal congestion , ear pain or sore throat. Denies chest congestion, or cough productive of sputum. Denies chest pain, palpitations, PND, orthopnea or leg swelling. Denies abdominal pain, nausea, vomitting, diarrhea or constipation. Denies change in bowel movements or bloody stool. Denies dysuria , frequency, incontinence or hesitancy. reports joint pain with reduced mobility Denies headaches, vertigo, seizures. Denies depression, anxiety or insomnia.reports memory loss Denies  rash, lesions, or itch.     Current Medications (verified): 1)  Stool Softener 100 Mg  Caps (Docusate Sodium) .... One Cap By Mouth Two Times A Day 2)  Benicar Hct 40-25 Mg  Tabs (Olmesartan Medoxomil-Hctz) .... One Tab By Mouth Once Daily 3)  B Complex-B12   Tabs (B Complex Vitamins) .... One Tab By Mouth Once Daily 4)  Amlodipine Besy-Benazepril Hcl 10-20 Mg  Caps (Amlodipine Besy-Benazepril Hcl) .... One Cap By Mouth Once Daily 5)  Abc Plus   Tabs (Multiple Vitamins-Minerals) .... One Tab By Mouth Once Daily 6)  Alprazolam 0.25 Mg Tabs (Alprazolam) .... Take 1 Tab By Mouth At Bedtime 7)  Ted Hose (Knee High) 8)  Geophysical data processor (Misc. Devices) .... Uad 9)  Poly-Iron 150 150 Mg Caps (Polysaccharide Iron Complex) .... One Cap By Mouth Two Times A Day  Allergies (verified): No Known  Drug Allergies  Review of Systems      See HPI Eyes:  Denies blurring and discharge. MS:  Complains of joint pain, low back pain, mid back pain, muscle weakness, and stiffness. Endo:  Complains of cold intolerance. Heme:  Denies abnormal bruising and bleeding. Allergy:  Denies hives or rash and sneezing.  Physical Exam  General:  alert, well-hydrated, and underweight appearing. pt is in a wheelchair as she does not ambulate safely HEENT: No facial asymmetry,  EOMI, No sinus tenderness, TM's Clear, oropharynx  pink and moist. Neck decreased ROM  Chest: Clear to auscultation bilaterally.  CVS: S1, S2, No murmurs, No S3.   Abd: Soft, Nontender.  ZO:XWRUEAVWU ROM spine, hips, shoulders and knees.  Ext: No edema. weak dorsalis pedis bilaterally  CNS: CN 2-12 intact,reduced  power and  tone throughout.Sensation is normal.    Skin: Intact, no visible lesions or rashes.  Psych: Good eye contact, normal affect.  Memory loss, not anxious or depressed appearing.     Impression & Recommendations:  Problem # 1:  ANXIETY (ICD-300.00) Assessment Unchanged  Her updated medication list for this problem includes:    Alprazolam 0.25 Mg Tabs (Alprazolam) .Marland Kitchen... Take 1 tab by mouth at bedtime  Problem # 2:  DEGENERATIVE JOINT DISEASE, SPINE (ICD-721.90) Assessment: Deteriorated  Problem # 3:  HYPERTENSION (ICD-401.9) Assessment: Unchanged  Her updated medication list for this problem includes:    Benicar Hct  40-25 Mg Tabs (Olmesartan medoxomil-hctz) ..... One tab by mouth once daily    Amlodipine Besy-benazepril Hcl 10-20 Mg Caps (Amlodipine besy-benazepril hcl) ..... One cap by mouth once daily  Orders: T-Basic Metabolic Panel [id number])  BP today: 120/70 Prior BP: 128/70 (08/17/2009)  Labs Reviewed: K+: 4.5 (01/03/2009) Creat: : 1.51 (01/03/2009)   Chol: 184 (01/03/2009)   HDL: 56 (01/03/2009)   LDL: 109 (01/03/2009)   TG: 94 (01/03/2009)  Complete Medication List: 1)  Stool  Softener 100 Mg Caps (Docusate sodium) .... One cap by mouth two times a day 2)  Benicar Hct 40-25 Mg Tabs (Olmesartan medoxomil-hctz) .... One tab by mouth once daily 3)  B Complex-b12 Tabs (B complex vitamins) .... One tab by mouth once daily 4)  Amlodipine Besy-benazepril Hcl 10-20 Mg Caps (Amlodipine besy-benazepril hcl) .... One cap by mouth once daily 5)  Abc Plus Tabs (Multiple vitamins-minerals) .... One tab by mouth once daily 6)  Alprazolam 0.25 Mg Tabs (Alprazolam) .... Take 1 tab by mouth at bedtime 7)  Ted Hose (knee High)  8)  [employer] (Misc. devices) .... Uad 9)  Poly-iron 150 150 Mg Caps (Polysaccharide iron complex) .... One cap by mouth two times a day  Other Orders: T-CBC w/Diff ([id number]) T-Lipid Profile ([id number])  Patient Instructions: 1)  Please schedule a follow-up appointment in 3.5 months. 2)  BMP prior to visit, ICD-9: 3)  Lipid Panel prior to visit, ICD-9:  fasting labs today 4)  TSH prior to visit, ICD-9:    5)  CBC w/ Diff prior to visit, ICD-9: fasting labs today

## 2010-12-12 NOTE — Letter (Signed)
Summary: labs  labs   Imported By: Curtis Sites 04/13/2010 11:47:26  _____________________________________________________________________  External Attachment:    Type:   Image     Comment:   External Document

## 2010-12-12 NOTE — Assessment & Plan Note (Signed)
Summary: recert   Allergies: No Known Drug Allergies   Complete Medication List: 1)  Stool Softener 100 Mg Caps (Docusate sodium) .... One cap by mouth two times a day 2)  Benicar Hct 40-25 Mg Tabs (Olmesartan medoxomil-hctz) .... One tab by mouth once daily 3)  B Complex-b12 Tabs (B complex vitamins) .... One tab by mouth once daily 4)  Amlodipine Besy-benazepril Hcl 10-20 Mg Caps (Amlodipine besy-benazepril hcl) .... One cap by mouth once daily 5)  Abc Plus Tabs (Multiple vitamins-minerals) .... One tab by mouth once daily 6)  Alprazolam 0.25 Mg Tabs (Alprazolam) .... Take 1 tab by mouth at bedtime 7)  Ted Hose (knee High)  8)  [employer] (Misc. devices) .... Uad recertification paper reviewed and signed off

## 2010-12-12 NOTE — Miscellaneous (Signed)
Summary: RECERT  RECERT   Imported By: Lind Guest 08/01/2010 08:08:32  _____________________________________________________________________  External Attachment:    Type:   Image     Comment:   External Document

## 2010-12-12 NOTE — Letter (Signed)
Summary: xray  xray   Imported By: Curtis Sites 04/13/2010 11:49:41  _____________________________________________________________________  External Attachment:    Type:   Image     Comment:   External Document

## 2010-12-12 NOTE — Miscellaneous (Signed)
Summary: Home Care Report  Home Care Report   Imported By: Lind Guest 04/06/2010 08:05:51  _____________________________________________________________________  External Attachment:    Type:   Image     Comment:   External Document

## 2010-12-12 NOTE — Assessment & Plan Note (Signed)
Summary: recert   Allergies: No Known Drug Allergies   Complete Medication List: 1)  Stool Softener 100 Mg Caps (Docusate sodium) .... One cap by mouth two times a day 2)  Amlodipine Besy-benazepril Hcl 10-20 Mg Caps (Amlodipine besy-benazepril hcl) .... One cap by mouth once daily 3)  Abc Plus Tabs (Multiple vitamins-minerals) .... One tab by mouth once daily 4)  Ted Hose (knee High)  5)  [employer] (Misc. devices) .... Uad 6)  Ferocon Caps (Fe fumarate-b12-vit c-fa-ifc) .... Take 1 tablet by mouth once a day 7)  Alprazolam 0.25 Mg Tabs (Alprazolam) .... One tab by mouth at bedtime  Other Orders: Re-certification of Home Health [phone])   Orders Added: 1)  Re-certification of Home Health [[id number]]

## 2010-12-12 NOTE — Assessment & Plan Note (Signed)
Summary: follow up- room 1   Vital Signs:  Patient profile:   77 year old female Menstrual status:  hysterectomy Height:      62 inches Weight:      103.50 pounds BMI:     19.00 O2 Sat:      99 % on Room air Pulse rate:   63 / minute Resp:     16 per minute BP sitting:   162 / 92  (left arm)  Vitals Entered By: Adella Hare LPN (April 26, 2010 2:48 PM) CC: follow up Is Patient Diabetic? No Pain Assessment Patient in pain? no        Primary Provider:  Syliva Overman MD  CC:  follow up.  History of Present Illness: Pt is here today for a check up.  She is accompanied by her daughter. She has no complaints or concerns.  Her daughter states that she doesnt eat much.  she has meals on wheels, but doesnt like alot of the food.  she prepares her other food herself.  She has an order for 1 Ensure daily but doesnt drink them.  Pt states they make her bowels lose.  But daughter states her bowels vary and often is constipated.  She takes a stool softener daily.  Daughter states that the home health nurse is now putting her medicines into individual doses so that it is easier to monitor that she is getting her meds and taking them daily. Her Benicar HCT bottle was last filled though in March 2011 and still has pills in it.  According to our records she was changed to Diovan HCT in April 2011 but she did not bring this bottle with her today.  Phone call to the pharmacy stated that she filled the Diovan HCT in April but not since.  Pt requests a refill of her "nerve pills."     Current Medications (verified): 1)  Stool Softener 100 Mg  Caps (Docusate Sodium) .... One Cap By Mouth Two Times A Day 2)  B Complex-B12   Tabs (B Complex Vitamins) .... One Tab By Mouth Once Daily 3)  Amlodipine Besy-Benazepril Hcl 10-20 Mg  Caps (Amlodipine Besy-Benazepril Hcl) .... One Cap By Mouth Once Daily 4)  Abc Plus   Tabs (Multiple Vitamins-Minerals) .... One Tab By Mouth Once Daily 5)  Ted Hose  (Knee High) 6)  Geophysical data processor (Misc. Devices) .... Uad 7)  Benicar Hct 40-25 Mg Tabs (Olmesartan Medoxomil-Hctz) .... One Tab By Mouth Once Daily  Allergies (verified): No Known Drug Allergies  Past History:  Past medical history reviewed for relevance to current acute and chronic problems.  Past Medical History: Reviewed history from 03/10/2008 and no changes required. Current Problems:  DIVERTICULAR DISEASE (ICD-562.10) ANXIETY (ICD-300.00) DEMENTIA (ICD-294.8) DEGENERATIVE JOINT DISEASE, SPINE (ICD-721.90) HYPERTENSION (ICD-401.9)  Social History: Retired widow NO biological children Never Smoked Alcohol use-no Drug use-no  Review of Systems General:  Complains of loss of appetite. CV:  Denies chest pain or discomfort, lightheadness, and palpitations. Resp:  Denies cough and shortness of breath. GI:  Complains of constipation, diarrhea, and loss of appetite; denies abdominal pain, nausea, and vomiting. Neuro:  Denies falling down and headaches.  Physical Exam  General:  Well-developed,well-nourished,in no acute distress; alert,appropriate and cooperative throughout examination Head:  Normocephalic and atraumatic without obvious abnormalities. No apparent alopecia or balding. Ears:  External ear exam shows no significant lesions or deformities.  Otoscopic examination reveals clear canals, tympanic membranes are intact bilaterally without bulging,  retraction, inflammation or discharge. Hearing is grossly normal bilaterally. Nose:  External nasal examination shows no deformity or inflammation. Nasal mucosa are pink and moist without lesions or exudates. Mouth:  Oral mucosa and oropharynx without lesions or exudates.   Neck:  No deformities, masses, or tenderness noted. Lungs:  Normal respiratory effort, chest expands symmetrically. Lungs are clear to auscultation, no crackles or wheezes. Heart:  Normal rate and regular rhythm. S1 and S2 normal without gallop, murmur,  click, rub or other extra sounds. Extremities:  No PTE Cervical Nodes:  No lymphadenopathy noted Psych:  normally interactive, good eye contact, not anxious appearing, and not depressed appearing.     Impression & Recommendations:  Problem # 1:  HYPERTENSION (ICD-401.9) Assessment Deteriorated Pt was previously well controlled.  Is not taking meds as prescribed.  The following medications were removed from the medication list:    Diovan Hct 320-25 Mg Tabs (Valsartan-hydrochlorothiazide) .Marland Kitchen... Take 1 tablet by mouth once a day    Benicar Hct 40-25 Mg Tabs (Olmesartan medoxomil-hctz) ..... One tab by mouth once daily Her updated medication list for this problem includes:    Amlodipine Besy-benazepril Hcl 10-20 Mg Caps (Amlodipine besy-benazepril hcl) ..... One cap by mouth once daily    Diovan Hct 320-25 Mg Tabs (Valsartan-hydrochlorothiazide) .Marland Kitchen... Take 1 daily  BP today: 162/92 Prior BP: 170/78 (01/31/2010)  Labs Reviewed: K+: 4.7 (12/21/2009) Creat: : 1.31 (12/21/2009)   Chol: 221 (12/21/2009)   HDL: 76 (12/21/2009)   LDL: 129 (12/21/2009)   TG: 81 (12/21/2009)  Problem # 2:  ANXIETY (ICD-300.00) Assessment: Comment Only  The following medications were removed from the medication list:    Alprazolam 0.25 Mg Tabs (Alprazolam) .Marland Kitchen... Take 1 tab by mouth at bedtime Her updated medication list for this problem includes:    Alprazolam 0.25 Mg Tabs (Alprazolam) ..... May take one at bedtime as needed for anxiety  Problem # 3:  WEIGHT LOSS (ICD-783.21) Assessment: New Decreased appetite.  And not drinking Ensure.  Will monitor.  Problem # 4:  DEMENTIA (ICD-294.8) Assessment: Unchanged  Complete Medication List: 1)  Stool Softener 100 Mg Caps (Docusate sodium) .... One cap by mouth two times a day 2)  B Complex-b12 Tabs (B complex vitamins) .... One tab by mouth once daily 3)  Amlodipine Besy-benazepril Hcl 10-20 Mg Caps (Amlodipine besy-benazepril hcl) .... One cap by mouth once  daily 4)  Abc Plus Tabs (Multiple vitamins-minerals) .... One tab by mouth once daily 5)  Ted Hose (knee High)  6)  [employer] (Misc. devices) .... Uad 7)  Diovan Hct 320-25 Mg Tabs (Valsartan-hydrochlorothiazide) .... Take 1 daily 8)  Alprazolam 0.25 Mg Tabs (Alprazolam) .... May take one at bedtime as needed for anxiety  Patient Instructions: 1)  Please schedule a follow-up appointment in 3 months with Dr Lodema Hong 2)  Drink your Ensure every day. 3)  You should have a new prescription for DiovanHCT at the pharmacy in place of BenicarHCT.  We will call the pharmacy to verify this. Prescriptions: ALPRAZOLAM 0.25 MG TABS (ALPRAZOLAM) may take one at bedtime as needed for anxiety  #30 x 1   Entered and Authorized by:   Esperanza Sheets PA   Signed by:   Esperanza Sheets PA on 04/26/2010   Method used:   Printed then faxed to ...       [facility]* (retail)       [address]  [place], [place]  [address]       Ph: [phone]       Fax: [phone]   RxID:   [id number]

## 2010-12-12 NOTE — Miscellaneous (Signed)
Summary: Home Care Report  Home Care Report   Imported By: Lind Guest 08/15/2010 13:29:44  _____________________________________________________________________  External Attachment:    Type:   Image     Comment:   External Document

## 2010-12-12 NOTE — Miscellaneous (Signed)
Summary: Home Care Report  Home Care Report   Imported By: Lind Guest 01/05/2010 09:44:58  _____________________________________________________________________  External Attachment:    Type:   Image     Comment:   External Document

## 2010-12-12 NOTE — Assessment & Plan Note (Signed)
Summary: dizzy spells- room 3   Vital Signs:  Patient profile:   77 year old female Menstrual status:  hysterectomy Height:      62 inches Weight:      106.25 pounds O2 Sat:      98 % on Room air Pulse rate:   80 / minute Resp:     16 per minute BP sitting:   170 / 78  (left arm)  Vitals Entered By: Adella Hare LPN (January 31, 2010 2:08 PM) CC: patient states she is not feeling well, having dizzy spells Is Patient Diabetic? No Pain Assessment Patient in pain? no        Primary Provider:  Syliva Overman MD  CC:  patient states she is not feeling well and having dizzy spells.  History of Present Illness: This very pleasant lady is here today with her daughter. She states she has been having some lightheadedness since last night.  No vertigo. Upon further questioning her daughter states she told her on the phone last night that she had some dizziness last night but hasn't c/o today. Pt lives alone.  She denies chest pain, paliptations or difficulty breathing. Her appetitie has been nl.  Pt states she has been taking her medications.  Her daughter says no.  I reviewed her medication/pill dispensor & it appears she hasn't taken any of her meds the last 3 days.   Current Medications (verified): 1)  Stool Softener 100 Mg  Caps (Docusate Sodium) .... One Cap By Mouth Two Times A Day 2)  Benicar Hct 40-25 Mg  Tabs (Olmesartan Medoxomil-Hctz) .... One Tab By Mouth Once Daily 3)  B Complex-B12   Tabs (B Complex Vitamins) .... One Tab By Mouth Once Daily 4)  Amlodipine Besy-Benazepril Hcl 10-20 Mg  Caps (Amlodipine Besy-Benazepril Hcl) .... One Cap By Mouth Once Daily 5)  Abc Plus   Tabs (Multiple Vitamins-Minerals) .... One Tab By Mouth Once Daily 6)  Alprazolam 0.25 Mg Tabs (Alprazolam) .... Take 1 Tab By Mouth At Bedtime 7)  Ted Hose (Knee High) 8)  Geophysical data processor (Misc. Devices) .... Uad 9)  Poly-Iron 150 150 Mg Caps (Polysaccharide Iron Complex) .... One Cap By Mouth Two  Times A Day  Allergies (verified): No Known Drug Allergies  Past History:  Past medical history reviewed for relevance to current acute and chronic problems.  Past Medical History: Reviewed history from 03/10/2008 and no changes required. Current Problems:  DIVERTICULAR DISEASE (ICD-562.10) ANXIETY (ICD-300.00) DEMENTIA (ICD-294.8) DEGENERATIVE JOINT DISEASE, SPINE (ICD-721.90) HYPERTENSION (ICD-401.9)  Review of Systems General:  Complains of chills; denies fever. CV:  Denies chest pain or discomfort, palpitations, and shortness of breath with exertion. Resp:  Denies shortness of breath. GI:  Denies vomiting and vomiting blood. GU:  Complains of urinary frequency; worse with medications, no change. Neuro:  Denies falling down, headaches, numbness, and tingling.  Physical Exam  General:  alert, cooperative to examination, and overweight-appearing.   Head:  Normocephalic and atraumatic without obvious abnormalities. No apparent alopecia or balding. Ears:  External ear exam shows no significant lesions or deformities.  Otoscopic examination reveals clear canals, tympanic membranes are intact bilaterally without bulging, retraction, inflammation or discharge. Hearing is grossly normal bilaterally. Nose:  External nasal examination shows no deformity or inflammation. Nasal mucosa are pink and moist without lesions or exudates. Mouth:  Oral mucosa and oropharynx without lesions or exudates. Neck:  No deformities, masses, or tenderness noted. Lungs:  Normal respiratory effort, chest expands symmetrically.  Lungs are clear to auscultation, no crackles or wheezes. Heart:  Normal rate and regular rhythm. S1 and S2 normal without gallop, murmur, click, rub or other extra sounds. Cervical Nodes:  No lymphadenopathy noted Psych:  good eye contact.     Impression & Recommendations:  Problem # 1:  HYPERTENSION (ICD-401.9) Assessment Deteriorated Encouraged better medication compliance.   Her BP was well controlled last mos at her OV. Discussed with daughter may need more monitoring to make sure she is taking her meds.  Her updated medication list for this problem includes:    Benicar Hct 40-25 Mg Tabs (Olmesartan medoxomil-hctz) ..... One tab by mouth once daily    Amlodipine Besy-benazepril Hcl 10-20 Mg Caps (Amlodipine besy-benazepril hcl) ..... One cap by mouth once daily  Problem # 2:  DIZZINESS (ICD-780.4) Assessment: New Daughter to monitor pt. Discussed may be due to not taking meds the last 3 days.  Complete Medication List: 1)  Stool Softener 100 Mg Caps (Docusate sodium) .... One cap by mouth two times a day 2)  Benicar Hct 40-25 Mg Tabs (Olmesartan medoxomil-hctz) .... One tab by mouth once daily 3)  B Complex-b12 Tabs (B complex vitamins) .... One tab by mouth once daily 4)  Amlodipine Besy-benazepril Hcl 10-20 Mg Caps (Amlodipine besy-benazepril hcl) .... One cap by mouth once daily 5)  Abc Plus Tabs (Multiple vitamins-minerals) .... One tab by mouth once daily 6)  Alprazolam 0.25 Mg Tabs (Alprazolam) .... Take 1 tab by mouth at bedtime 7)  Ted Hose (knee High)  8)  [employer] (Misc. devices) .... Uad 9)  Poly-iron 150 150 Mg Caps (Polysaccharide iron complex) .... One cap by mouth two times a day  Patient Instructions: 1)  Keep your appt with Dr Lodema Hong in June. 2)  You need to take your medication every day.  You blood pressure is high today because your not taking your medication.  Appended Document: dizzy spells- room 3 02/01/10 I asked Lilyan Gilford LPN to call pt to see how she is feeling today. Pt reports that she is feeling much better today since eating, & taking meds.

## 2010-12-12 NOTE — Miscellaneous (Signed)
Summary: Home Care Report  Home Care Report   Imported By: Lind Guest 12/06/2009 10:41:05  _____________________________________________________________________  External Attachment:    Type:   Image     Comment:   External Document

## 2010-12-12 NOTE — Letter (Signed)
Summary: misc  misc   Imported By: Curtis Sites 04/13/2010 11:57:38  _____________________________________________________________________  External Attachment:    Type:   Image     Comment:   External Document

## 2010-12-12 NOTE — Letter (Signed)
Summary: [facility]  [facility]   Imported By: Lind Guest 01/03/2010 09:30:52  _____________________________________________________________________  External Attachment:    Type:   Image     Comment:   External Document

## 2010-12-12 NOTE — Assessment & Plan Note (Signed)
Summary: RECERT   Allergies: No Known Drug Allergies   Complete Medication List: 1)  Stool Softener 100 Mg Caps (Docusate sodium) .... One cap by mouth two times a day 2)  Benicar Hct 40-25 Mg Tabs (Olmesartan medoxomil-hctz) .... One tab by mouth once daily 3)  B Complex-b12 Tabs (B complex vitamins) .... One tab by mouth once daily 4)  Amlodipine Besy-benazepril Hcl 10-20 Mg Caps (Amlodipine besy-benazepril hcl) .... One cap by mouth once daily 5)  Abc Plus Tabs (Multiple vitamins-minerals) .... One tab by mouth once daily 6)  Alprazolam 0.25 Mg Tabs (Alprazolam) .... Take 1 tab by mouth at bedtime 7)  Ted Hose (knee High)  8)  [employer] (Misc. devices) .... Uad 9)  Poly-iron 150 150 Mg Caps (Polysaccharide iron complex) .... One cap by mouth two times a day recert reviwed nad signed

## 2010-12-20 NOTE — Progress Notes (Signed)
Summary: going to ed  Phone Note Call from Patient   Summary of Call: called and said that she is going [facility] to the ed she wanted you to know that she was going because of hemm. bowel Initial call taken by: Lind Guest,  November 16, 2010 10:07 AM  Follow-up for Phone Call        noted, pls sched hosp f/u in next 1 to 3 weeks Follow-up by: Syliva Overman MD,  November 20, 2010 1:02 PM  Additional Follow-up for Phone Call Additional follow up Details #1::        NO ANSWER X 2 TODAY Additional Follow-up by: Lind Guest,  November 22, 2010 2:17 PM    Additional Follow-up for Phone Call Additional follow up Details #2::    no answer x 2 today Follow-up by: Lind Guest,  November 29, 2010 11:21 AM  Additional Follow-up for Phone Call Additional follow up Details #3:: Details for Additional Follow-up Action Taken: [name] SAID THAT SHE WAS AT [facility] IN [place] Additional Follow-up by: Lind Guest,  December 11, 2010 10:44 AM  noted, thanks

## 2010-12-20 NOTE — Letter (Signed)
Summary: Discharge Summary  Discharge Summary   Imported By: Lind Guest 12/11/2010 11:29:42  _____________________________________________________________________  External Attachment:    Type:   Image     Comment:   External Document

## 2010-12-26 ENCOUNTER — Telehealth: Payer: Self-pay | Admitting: Family Medicine

## 2010-12-27 ENCOUNTER — Encounter: Payer: Self-pay | Admitting: Family Medicine

## 2011-01-03 NOTE — Progress Notes (Signed)
Summary: speak with nurse  Phone Note Call from Patient   Summary of Call: needs to speak with nurse about getting nurse to come back out. [phone] Initial call taken by: Rudene Anda,  December 26, 2010 11:29 AM  Follow-up for Phone Call        may we send order to resume home health Follow-up by: Adella Hare LPN,  December 26, 2010 5:01 PM  Additional Follow-up for Phone Call Additional follow up Details #1::        yes pls do, assistance with meds, bp checks, also  Additional Follow-up by: Syliva Overman MD,  December 26, 2010 5:02 PM    Additional Follow-up for Phone Call Additional follow up Details #2::    will send referal Follow-up by: Adella Hare LPN,  December 26, 2010 5:08 PM

## 2011-01-03 NOTE — Letter (Signed)
Summary: to [facility]  to inter[facility]   Imported By: Lind Guest 12/27/2010 16:05:59  _____________________________________________________________________  External Attachment:    Type:   Image     Comment:   External Document

## 2011-01-15 ENCOUNTER — Encounter: Payer: Self-pay | Admitting: Family Medicine

## 2011-01-15 ENCOUNTER — Ambulatory Visit (INDEPENDENT_AMBULATORY_CARE_PROVIDER_SITE_OTHER): Payer: Medicare Other | Admitting: Family Medicine

## 2011-01-15 DIAGNOSIS — F411 Generalized anxiety disorder: Secondary | ICD-10-CM

## 2011-01-15 DIAGNOSIS — I10 Essential (primary) hypertension: Secondary | ICD-10-CM

## 2011-01-15 DIAGNOSIS — F068 Other specified mental disorders due to known physiological condition: Secondary | ICD-10-CM

## 2011-01-17 ENCOUNTER — Encounter: Payer: Self-pay | Admitting: Family Medicine

## 2011-01-23 ENCOUNTER — Encounter: Payer: Self-pay | Admitting: Family Medicine

## 2011-01-23 NOTE — Letter (Signed)
Summary: transpoation  transpoation   Imported By: Lind Guest 01/17/2011 11:11:15  _____________________________________________________________________  External Attachment:    Type:   Image     Comment:   External Document

## 2011-01-30 NOTE — Assessment & Plan Note (Signed)
Summary: f up from  hospital   Vital Signs:  Patient profile:   77 year old female Menstrual status:  hysterectomy Height:      62 inches Weight:      86 pounds BMI:     15.79 O2 Sat:      97 % Pulse rate:   65 / minute Pulse rhythm:   regular Resp:     16 per minute BP sitting:   160 / 74  (left arm)  Vitals Entered By: Everitt Amber LPN (January 14, 1609 9:58 AM) CC: ER follow up   Primary Care Provider:  Syliva Overman MD  CC:  ER follow up.  History of Present Illness: Pt was hositalised for acute diverticular bleed , and then placed in a skilled facility, which she left against medical advice. She is back living on her own, but reports that a niece is going to take her to live with herout of state in the near future. her apetite is poor, and she ha lost weight. shedeniesuncontrolled pain, an has hadno falls, although her mobilityis severely limited. She denies any recent feve or chills. She denies head or chest congestion.She denies abdominalpain or rectal bleeding. She reports joint pain and stifness. She reports chronic anxiety and poor sleep  Current Medications (verified): 1)  Stool Softener 100 Mg  Caps (Docusate Sodium) .... One Cap By Mouth Two Times A Day 2)  Amlodipine Besy-Benazepril Hcl 10-20 Mg  Caps (Amlodipine Besy-Benazepril Hcl) .... One Cap By Mouth Once Daily 3)  Abc Plus   Tabs (Multiple Vitamins-Minerals) .... One Tab By Mouth Once Daily 4)  Ted Hose (Knee High) 5)  Geophysical data processor (Misc. Devices) .... Uad 6)  Alprazolam 0.25 Mg Tabs (Alprazolam) .... One Tab By Mouth At Bedtime  Allergies (verified): No Known Drug Allergies  Past History:  Past Medical History: Current Problems:  DIVERTICULAR DISEASE (ICD-562.10) ANXIETY (ICD-300.00) DEMENTIA (ICD-294.8) DEGENERATIVE JOINT DISEASE, SPINE (ICD-721.90) HYPERTENSION (ICD-401.9) hospitalised in 2012 for GI bleede then placed in nursing facility, which she left against medicaladvice.plans to  leave to live out of state with a niece in thenear future  Family History: Both parents deceased causes unknown  Siblings none living  Review of Systems      See HPI General:  Complains of fatigue, malaise, sleep disorder, weakness, and weight loss. Eyes:  Complains of vision loss-both eyes. CV:  Denies chest pain or discomfort, palpitations, and swelling of feet. Resp:  Denies cough and sputum productive. GI:  Denies abdominal pain, constipation, diarrhea, nausea, and vomiting. GU:  Denies dysuria and urinary frequency. Neuro:  Complains of memory loss.  Physical Exam  General:  Elderly female, adequately hydrated, undrnourishedin npo C/p distress HEENT: No facial asymmetry,  EOMI, No sinus tenderness, TM's Clear, oropharynx  pink and moist. Edentulous.Neck decreased ROM, no jVD or adenopathy  Chest: Clear to auscultation bilaterally.  CVS: S1, S2, No murmurs, No S3.   Abd: Soft, Nontender.  RU:EAVWUJWJX  ROM spine, hips, shoulders and knees.  Ext: No edema.   CNS: CN 2-12 intact, power tone and sensation normal throughout.   Skin: Intact, no visible lesions or rashes.  Psych: Good eye contact, normal affect.  Memory loss, not anxious or depressed appearing.    Impression & Recommendations:  Problem # 1:  WEIGHT LOSS (ICD-783.21) Assessment Comment Only pt todrink ensure daily and make an effort to gain weight.she is to resume daily multivitamin  Problem # 2:  DEMENTIA (ICD-294.8) Assessment: Deteriorated  Problem # 3:  HYPERTENSION (ICD-401.9) Assessment: Deteriorated  The following medications were removed from the medication list:    Amlodipine Besy-benazepril Hcl 10-20 Mg Caps (Amlodipine besy-benazepril hcl) ..... One cap by mouth once daily Her updated medication list for this problem includes:    Amlodipine Besy-benazepril Hcl 10-20 Mg Caps (Amlodipine besy-benazepril hcl) .Marland Kitchen... Take 1 capsule by mouth once a day  BP today: 160/74 Prior BP: 148/84  (09/21/2010)  Labs Reviewed: K+: 4.7 (12/21/2009) Creat: : 1.31 (12/21/2009)   Chol: 221 (12/21/2009)   HDL: 76 (12/21/2009)   LDL: 129 (12/21/2009)   TG: 81 (12/21/2009)  Orders: Medicare Electronic Prescription [phone])  Problem # 4:  ANXIETY (ICD-300.00) Assessment: Deteriorated  The following medications were removed from the medication list:    Alprazolam 0.25 Mg Tabs (Alprazolam) ..... One tab by mouth at bedtime Her updated medication list for this problem includes:    Alprazolam 0.25 Mg Tabs (Alprazolam) .Marland Kitchen... Take 1 tab by mouth at bedtime  Orders: Medicare Electronic Prescription [phone])  Complete Medication List: 1)  Ted Hose (knee High)  2)  [employer] (Misc. devices) .... Uad 3)  Alprazolam 0.25 Mg Tabs (Alprazolam) .... Take 1 tab by mouth at bedtime 4)  Amlodipine Besy-benazepril Hcl 10-20 Mg Caps (Amlodipine besy-benazepril hcl) .... Take 1 capsule by mouth once a day 5)  Docusate Sodium 100 Mg Caps (Docusate sodium) .... Take 1 capsule by mouth once a day 6)  Centrum Liqd (Multiple vitamins-minerals) .... Two teaspoons once daily  Patient Instructions: 1)  Please schedule a follow-up appointment in 2 months. 2)  Medications are as listed. 3)  Pls call with problems 4)  PLS EAT MORE OFTEN AND INCREASE THE AMOUNT YOU EAT Prescriptions: AMLODIPINE BESY-BENAZEPRIL HCL 10-20 MG CAPS (AMLODIPINE BESY-BENAZEPRIL HCL) Take 1 capsule by mouth once a day  #30 x 3   Entered by:   Everitt Amber LPN   Authorized by:   Syliva Overman MD   Signed by:   Everitt Amber LPN on 96/29/5284   Method used:   Printed then faxed to ...       [facility]* (retail)       [address]       [place], [place]  [address]       Ph: [phone]       Fax: [phone]   RxID:   [id number] DOCUSATE SODIUM 100 MG CAPS (DOCUSATE SODIUM) Take 1 capsule by mouth once a day  #30 x 3   Entered by:   Everitt Amber LPN   Authorized by:   Syliva Overman  MD   Signed by:   Everitt Amber LPN on 41/66/0630   Method used:   Printed then faxed to ...       [facility]* (retail)       [address]       [place], [place]  [address]       Ph: [phone]       Fax: [phone]   RxID:   [id number] CENTRUM  LIQD (MULTIPLE VITAMINS-MINERALS) two teaspoons once daily  #300 cc x 3   Entered by:   Everitt Amber LPN   Authorized by:   Syliva Overman MD   Signed by:   Everitt Amber LPN on 10/62/6948   Method used:   Printed then faxed to ...       [facility]* (  retail)       [address]       [place], [place]  [address]       Ph: [phone]       Fax: [phone]   RxID:   [id number] CENTRUM  LIQD (MULTIPLE VITAMINS-MINERALS) two teaspoons once daily  #300 cc x 3   Entered and Authorized by:   Syliva Overman MD   Signed by:   Syliva Overman MD on 01/15/2011   Method used:   Electronically to        [facility]* (retail)       [address] [place], [place]  [address]       Ph: [phone]       Fax: [phone]   RxID:   [id number] DOCUSATE SODIUM 100 MG CAPS (DOCUSATE SODIUM) Take 1 capsule by mouth once a day  #30 x 2   Entered and Authorized by:   Syliva Overman MD   Signed by:   Syliva Overman MD on 01/15/2011   Method used:   Electronically to        [facility]* (retail)       [address]       [place], [place]  [address]       Ph: [phone]       Fax: [phone]   RxID:   [id number] AMLODIPINE BESY-BENAZEPRIL HCL 10-20 MG CAPS (AMLODIPINE BESY-BENAZEPRIL HCL) Take 1 capsule by mouth once a day  #30 x 2   Entered and Authorized by:   Syliva Overman MD   Signed by:   Syliva Overman MD on 01/15/2011   Method used:   Electronically to        [facility]* (retail)       [address]       [place], [place]   [address]       Ph: [phone]       Fax: [phone]   RxID:   [id number] ALPRAZOLAM 0.25 MG TABS (ALPRAZOLAM) Take 1 tab by mouth at bedtime  #30 x 1   Entered and Authorized by:   Syliva Overman MD   Signed by:   Syliva Overman MD on 01/15/2011   Method used:   Printed then faxed to ...       [facility]* (retail)       [address]       [place], [place]  [address]       Ph: [phone]       Fax: [phone]   RxID:   [id number]    Orders Added: 1)  Est. Patient Level IV [19509] 2)  Medicare Electronic Prescription [phone]

## 2011-01-30 NOTE — Miscellaneous (Signed)
Summary: intermin  intermin   Imported By: Lind Guest 01/23/2011 10:34:41  _____________________________________________________________________  External Attachment:    Type:   Image     Comment:   External Document

## 2011-02-19 ENCOUNTER — Encounter: Payer: Self-pay | Admitting: Family Medicine

## 2011-02-20 ENCOUNTER — Ambulatory Visit: Payer: Medicare Other | Admitting: Family Medicine

## 2011-02-21 ENCOUNTER — Telehealth: Payer: Self-pay | Admitting: Family Medicine

## 2011-02-21 DIAGNOSIS — M47816 Spondylosis without myelopathy or radiculopathy, lumbar region: Secondary | ICD-10-CM

## 2011-02-21 NOTE — Telephone Encounter (Signed)
pls order wheelchair, enter pt's last weight  On order , and a note to say that her current chair is too large, thanks

## 2011-02-23 ENCOUNTER — Telehealth: Payer: Self-pay | Admitting: Family Medicine

## 2011-02-28 ENCOUNTER — Telehealth: Payer: Self-pay | Admitting: Family Medicine

## 2011-03-01 NOTE — Telephone Encounter (Signed)
She has a 14in already and that is the smallest they provide

## 2011-03-01 NOTE — Telephone Encounter (Signed)
pls let pt and caregiver know this

## 2011-03-02 NOTE — Telephone Encounter (Signed)
Called patient no answer.

## 2011-03-08 NOTE — Telephone Encounter (Signed)
No answer

## 2011-03-16 DIAGNOSIS — I10 Essential (primary) hypertension: Secondary | ICD-10-CM

## 2011-03-16 DIAGNOSIS — R32 Unspecified urinary incontinence: Secondary | ICD-10-CM

## 2011-03-16 DIAGNOSIS — F0280 Dementia in other diseases classified elsewhere without behavioral disturbance: Secondary | ICD-10-CM

## 2011-03-19 ENCOUNTER — Encounter: Payer: Self-pay | Admitting: Family Medicine

## 2011-03-20 ENCOUNTER — Ambulatory Visit (INDEPENDENT_AMBULATORY_CARE_PROVIDER_SITE_OTHER): Payer: Medicare Other | Admitting: Family Medicine

## 2011-03-20 ENCOUNTER — Encounter: Payer: Self-pay | Admitting: Family Medicine

## 2011-03-20 ENCOUNTER — Other Ambulatory Visit: Payer: Self-pay | Admitting: Family Medicine

## 2011-03-20 VITALS — BP 162/70 | HR 75 | Resp 16 | Wt 106.0 lb

## 2011-03-20 DIAGNOSIS — I10 Essential (primary) hypertension: Secondary | ICD-10-CM

## 2011-03-20 DIAGNOSIS — F411 Generalized anxiety disorder: Secondary | ICD-10-CM

## 2011-03-20 DIAGNOSIS — M479 Spondylosis, unspecified: Secondary | ICD-10-CM

## 2011-03-20 DIAGNOSIS — Z23 Encounter for immunization: Secondary | ICD-10-CM

## 2011-03-20 DIAGNOSIS — K5730 Diverticulosis of large intestine without perforation or abscess without bleeding: Secondary | ICD-10-CM

## 2011-03-20 NOTE — Assessment & Plan Note (Signed)
Controlled, no change in medication  

## 2011-03-20 NOTE — Assessment & Plan Note (Addendum)
Uncontrolled, based on the fact she still has med bottle filled 2 months ago uncertain  of compliance,m she does have h/h nursing to put out her meds, will send a msg

## 2011-03-20 NOTE — Assessment & Plan Note (Signed)
Progressing, pt is wheelchair most of the time, no c/o pain

## 2011-03-20 NOTE — Patient Instructions (Addendum)
All the best in your new home in [place].  I know that  You will do well.  I will get recent labs and x ray that you had at [facility]

## 2011-03-20 NOTE — Progress Notes (Signed)
  Subjective:    Patient ID: [name], female    DOB: 1906/09/14, 77 y.o.   MRN: [id number]  HPI Pt in for f/u before relocation out of state. Unfortunately, in the last approx 2 weeks , her wjheelchair accidentally fell on her right leg. She has bruising , swelling and tenderness in the area. She was evaluated at the [facility] ED at the time of the accident, labs and assumedly x rays, the data will be sent for . She is less painful and able to weight bear  She denies any recent intestinal bleeds. Her spirit is good, she is looking forward to living with her niece.   Review of Systems Denies recent fever or chills. Denies sinus pressure, nasal congestion, ear pain or sore throat. Denies chest congestion, productive cough or wheezing. Denies chest pains, palpitations, paroxysmal nocturnal dyspnea, orthopnea and leg swelling Denies abdominal pain, nausea, vomiting,diarrhea or constipation.  Denies rectal bleeding . Denies dysuria, frequency, hesitancy or incontinence. . Denies skin break down or rash.        Objective:   Physical Exam Patient alert and oriented and in no Cardiopulmonary distress.  HEENT: No facial asymmetry, EOMI, no sinus tenderness, TM's clear, Oropharynx pink and moist.  Neck supple no adenopathy.  Chest: Clear to auscultation bilaterally.  CVS: S1, S2 no murmurs, no S3.  ABD: Soft non tender. Bowel sounds normal.  Ext: No edema  GL:OVFIEPPIR ROM spine, shoulders, hips and knees.Tender swelling of right leg  Skin: Intact, no ulcerations or rash noted.  Psych: Good eye contact, normal affect. Memory loss,  not anxious or depressed appearing.  CNS: CN 2-12 intact, power, tone and sensation normal throughout.        Assessment & Plan:

## 2011-03-20 NOTE — Assessment & Plan Note (Signed)
Stable at this time , but pt has had multiple episodes of hospitalizations for acute bleed

## 2011-03-21 ENCOUNTER — Other Ambulatory Visit: Payer: Self-pay

## 2011-03-21 ENCOUNTER — Telehealth: Payer: Self-pay | Admitting: Family Medicine

## 2011-03-21 MED ORDER — AMLODIPINE BESY-BENAZEPRIL HCL 10-20 MG PO CAPS: 1.0000 | ORAL_CAPSULE | Freq: Every day | ORAL | Status: DC

## 2011-03-21 MED ORDER — CEPHALEXIN 500 MG PO CAPS: 500.0000 mg | ORAL_CAPSULE | Freq: Two times a day (BID) | ORAL | Status: AC

## 2011-03-21 MED ORDER — ALPRAZOLAM 0.25 MG PO TABS: 0.2500 mg | ORAL_TABLET | Freq: Every evening | ORAL | Status: DC | PRN

## 2011-03-21 NOTE — Telephone Encounter (Signed)
pls send a message  To lydia let her k now med is being sent in for the sore on [name]'s leg, keflex 500mg  one twice daily #10 only, and send it in please, also check with her that she is giving her the prescribed dose of bp med, let hr know the bp was high and there seemed to be probably an excess of pills

## 2011-03-21 NOTE — Telephone Encounter (Signed)
Letter sent to interim advising of med changes

## 2011-03-21 NOTE — Telephone Encounter (Signed)
Addended by: Adella Hare on: 03/21/2011 11:25 AM   Modules accepted: Orders
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
## 2011-03-27 NOTE — Discharge Summary (Signed)
[name], [name]           ACCOUNT NO.:  [id number]   MEDICAL RECORD NO.:  [id number]          PATIENT TYPE:  OBV   LOCATION:  A214                          FACILITY:  [facility]   PHYSICIAN:  Margaret Singh, DO    DATE OF BIRTH:  Jul 03, 1906   DATE OF ADMISSION:  09/12/2007  DATE OF DISCHARGE:  10/31/2008LH                               DISCHARGE SUMMARY   ADMISSION DIAGNOSIS:  Gastrointestinal bleed.   DISCHARGE DIAGNOSES:  1. Bright red blood per rectum.  2. Hypertension.  3. Anemia.   PRIMARY CARE PHYSICIAN:  [name]. [name], M.D.   CONSULTATIONS:  None.   PROCEDURE:  None.   HISTORY OF PRESENT ILLNESS:  Done by Margaret Singh, DO and please  refer to that.  To summarize, this is a 77 year old female who presented  from her primary care physician's office, Dr. Lodema Jackson, with complaint of  rectal bleeding.  It was witnessed as well.  An H&H that was done in the  office showed that she was severely anemic. When she showed up to the ER  her H&H was slightly higher.  It was determined that we would hold her  for 23 hours and then check her serial H&H's to make sure that she did  not have anymore additional bleeding.   HOSPITAL COURSE:  While she was here, she had no problems over the  evening.  She had no repeat episodes of any rectal bleeding.  Also the  patient admits to laxative abuse which may have contributed to this as  well.  We will recommend that she actually have a stool softener which  may assist her with this instead of using laxatives.   CONDITION ON DISCHARGE:  She will be discharged to home stable.   DISPOSITION:  To home and to follow up with Dr. Lodema Jackson.   DISCHARGE MEDICATIONS:  1. Benicar HCT 40/25 mg one p.o. daily.  2. Amlodipine Benazepril 10/21 p.o. daily.  3. Xanax 0.25 mg one p.o. as needed.  4. Nu-Iron 150 mg one daily.  5. Colace 100 mg one daily which is over-the-counter.  She can      purchase that herself.   FOLLOWUP:  She is  instructed to follow up with Dr. Anthony Jackson office on  Monday and to return to her if she has any other issues regarding this.      Margaret Singh, DO  Electronically Signed     CB/MEDQ  D:  09/13/2007  T:  09/13/2007  Job:  [id number]

## 2011-03-29 ENCOUNTER — Telehealth: Payer: Self-pay | Admitting: Family Medicine

## 2011-03-29 NOTE — Telephone Encounter (Signed)
error 

## 2011-03-30 NOTE — Discharge Summary (Signed)
NAME:  [name], [name]                     ACCOUNT NO.:  [id number]   MEDICAL RECORD NO.:  [id number]                   PATIENT TYPE:  INP   LOCATION:  A216                                 FACILITY:  [facility]   PHYSICIAN:  Dirk Dress. Katrinka Blazing, M.D.                DATE OF BIRTH:  Apr 07, 1906   DATE OF ADMISSION:  09/26/2002  DATE OF DISCHARGE:  09/28/2002                                 DISCHARGE SUMMARY   DISCHARGE DIAGNOSES:  1. Acute syncopal episode, etiology undefined.  2. Hypertension.  3. Diverticulosis.  4. Chronic anemia.  5. Chronic renal insufficiency.  6. Senile dementia.   DISPOSITION:  The patient was discharged home in satisfactory and stable  condition.   DISCHARGE MEDICATIONS:  1. Levaquin 250 mg q.d.  2. Carafate 1 g a.c. and h.s.  3. Aciphex 20 mg q.d.  4. Hydrochlorothiazide 25 mg q.d.  5. Norvasc 5 mg q.d.  6. Lotensin 10 mg q.d.  7. Xanax 0.25 mg q.h.s.   FOLLOW-UP:  The patient is scheduled to be seen in the office by Dr. Lodema Hong  on October 05, 2002.   HISTORY OF PRESENT ILLNESS:  This 77 year old female was admitted for  evaluation of a syncopal episode.  The patient stated that she was feeling  quite well when she awakened on the morning of admission.  She walked to her  mail box to mail a letter.  On the way back to the house, she passed out.  She stated quite clearly that she completely passed out.  When she came to,  she tried to get up and realized that she could not.  She was seen by a  neighbor, who assisted her back to the house and then called EMS.  EMS  evaluation revealed her to be stable on the initial encounter and she  remained stable during transfer.  In the emergency room, telemetry showed  multiple PVCs and PACs, which were stable rate and rhythm.  She had no chest  pain, shortness of breath, palpitations, heart flutters, headache, blurred  vision, or change in visual fields.  She was not incontinent of stool or  urine.  She denied  any weakness of the arms, legs, or face.  She did not  have any change in her speech.  There was no history to suggest that she had  a disoriented postictal period.  Because of complete syncope, it was felt  that the patient needed to be observed.   LABORATORY DATA:  A CT of the head was done and this was normal.  The EKG  showed multiple PVCs and PACs.   PHYSICAL EXAMINATION:  The physical examination was totally unremarkable.  The oxygen saturation was 98%.  She did not have any motor, sensory, or  cerebellar deficit.  Gait and stance were unremarkable.   HOSPITAL COURSE:  The patient was monitored.  Cardiac enzymes were done and  were unremarkable.  She  was scheduled for progressive ambulation and she did  well.  Telemetry during the hospital stay showed normal sinus rhythm with  occasional PVCs.  Carotid Doppler studies were done and these showed diffuse  bilateral carotid intimal thickening with focal plaque in the left carotid  bulb, but without any significant luminal narrowing.  The patient remained  stable.  Activity was increased.  She was discharged on September 28, 2002,  in satisfactory condition.                                               Dirk Dress. Katrinka Blazing, M.D.    LCS/MEDQ  D:  11/07/2002  T:  11/07/2002  Job:  [id number]

## 2011-03-30 NOTE — Group Therapy Note (Signed)
NAMEPINKI, [name]           ACCOUNT NO.:  [id number]   MEDICAL RECORD NO.:  [id number]          PATIENT TYPE:  INP   LOCATION:  A330                          FACILITY:  [facility]   PHYSICIAN:  Margaretmary Dys, M.D.DATE OF BIRTH:  Aug 26, 1906   DATE OF PROCEDURE:  12/29/2006  DATE OF DISCHARGE:                                 PROGRESS NOTE   SUBJECTIVE:  Patient feels much better today.  She has had no fevers or  chills.  She said her cough is better, continues to cough up some  yellowish sputum, feels stronger and will likely get out of bed today.  Patient was admitted with questionable left-sided community-acquired  pneumonia.   OBJECTIVE:  Conscious, alert, well oriented in time, place and person.  A very pleasant elderly African-American lady.  VITAL SIGNS:  Blood pressure was 109/63, pulse of 76, respiration of 18,  temperature 98.4, oxygen saturation was 97% on room air.  HEENT:  Normocephalic, atraumatic, oral mucosa was moist with no  exudate.  NECK:  Supple, no JVD, no lymphadenopathy.  LUNGS:  Some crackles on the right base, otherwise no rhonchi or  wheezing was heard.  HEART:  S1, S2 regular, no S3, S4, gallops or rubs.  ABDOMEN:  Soft, nontender, bowel sounds positive, no mass palpable.  EXTREMITIES:  No edema.   LABORATORY DATA:  White blood cell count was 3.1, hemoglobin 9.7,  hematocrit was 29.2, platelet count was 186.   Sodium was 140, potassium 4.3, chloride of 111, CO2 23, glucose of 92,  BUN of 14, creatinine was 1.2.   Calcium was 8.   Blood cultures negative thus far.   ASSESSMENT AND PLAN:  1. Generalized weakness likely related to community-acquired      pneumonia, the patient feels better, we will try to ambulate with      assistance today and see how she does.  2. Community-acquired pneumonia.  Patient remains afebrile.  Her white      cell count has improved.  She was      slightly neutropenic when she arrived here.  Will continue her on       Levaquin 750 mg IV every 48 hours.  3. Deep vein thrombosis prophylaxis with Lovenox.   DISPOSITION:  Patient continues to do very well.  Anticipate discharge  home in the next 1-2 days.      Margaretmary Dys, M.D.  Electronically Signed     AM/MEDQ  D:  12/29/2006  T:  12/29/2006  Job:  [id number]

## 2011-03-30 NOTE — Group Therapy Note (Signed)
   NAME:  [name], [name]                     ACCOUNT NO.:  [id number]   MEDICAL RECORD NO.:  [id number]                   PATIENT TYPE:  INP   LOCATION:  A216                                 FACILITY:  [facility]   PHYSICIAN:  Edward L. Juanetta Gosling, M.D.             DATE OF BIRTH:  1906-04-23   DATE OF PROCEDURE:  09/26/2002  DATE OF DISCHARGE:  09/28/2002                                   PROGRESS NOTE   DATE AND TIME OF TEST:  September 26, 2002 at 1231.   IMPRESSION:  The rhythm appears to be sinus rhythm with PVCs.  There is left  ventricular hypertrophy with strain.  There is probable left atrial  enlargement.  Slow R wave progression across the precordium may indicate a  previous anterior myocardial infarction and clinical correlation is  suggested.  Abnormal electrocardiogram.                                               Oneal Deputy. Juanetta Gosling, M.D.    Gwenlyn Found  D:  11/10/2002  T:  11/11/2002  Job:  [id number]

## 2011-03-30 NOTE — Group Therapy Note (Signed)
NAMEKATHERINA, WIMER           ACCOUNT NO.:  [id number]   MEDICAL RECORD NO.:  [id number]          PATIENT TYPE:  INP   LOCATION:  A216                          FACILITY:  [facility]   PHYSICIAN:  Osvaldo Shipper, MD     DATE OF BIRTH:  [birth year]   DATE OF PROCEDURE:  05/10/2006  DATE OF DISCHARGE:                                   PROGRESS NOTE   SUBJECTIVE:  Patient feels better.  She repeats that she had bright red  blood per rectum last night.  She has not had any bowel movements today.  She did have a colonoscopy yesterday which was quite uneventful for the  patient.  Patient does not have any shortness of breath, no chest pain.  She  is complaining of some pain over her IV site on the right hand.  Otherwise  she feels quite well.   OBJECTIVE:  VITAL SIGNS:  Temperatures have been normal.  Heart rate in the  80s.  Respiratory rate 16, blood pressure running a little bit elevated  today, this morning it is about 142/68.  HEENT:  There is no pallor.  LUNGS:  Clear to auscultation.  Some rhonchi bilaterally but no rales  appreciated.  CARDIOVASCULAR:  Stress test normal.  Regular.  No murmurs appreciated.  ABDOMEN:  Soft.  Nontender.  Nondistended.  Bowel sounds are present.  No  masses or organomegaly appreciated.  EXTREMITIES:  Without edema.  Pulses are palpable.   LABORATORY DATA:  Her hemoglobin staying in the 11.3 range for the past 3  readings over 6 hours each.  BMP also show a mildly elevated chloride,  otherwise unremarkable.   ASSESSMENT/PLAN:  1.  Gastrointestinal bleed.  Patient underwent colonoscopy yesterday which      showed numerous large scattered diverticula in the ascending colon,      transverse colon and the sigmoid colon.  There was old blood throughout      the entire colon but no fresh blood noted.  This colonoscopy was done by      Dr. Katrinka Blazing.  Patient has had a bloody bowel movement last night.  Her H&H      has remained stable however.  Since the  patient lives alone, she has      refused nursing home placement, I think it would be prudent to observe      her at least for 24 more hours to make sure she does not have any more      blood in her stools and that her H&H remain stable.  If her H&H is      stable by tomorrow morning I think she may be able to go back home. She      is still on full liquid diet, awaiting Dr. Katrinka Blazing to see her today and      maybe advance the diet if it is okay with him.   Hypertension:  Lotrel was restarted yesterday.  Blood pressure is still not  optimal but somewhat improved.  Maybe Benicar 50 can be instituted as well.   DISPOSITION:  I have discussed with the patient regarding  placement and she  adamantly refuses any attempts at this.  She mentions that she has a home  and that she would like to go back there.  I think we will try to arrange  home health and maybe a nursing aid to live with her.  Apparently her  daughters are very concerned about her living alone; however, patient has  full mental capacity.  She is refusing nursing home placement at this time.   Once H&H is stable and once she has normal bowel movement without any blood,  I think she may be able to go back home.      Osvaldo Shipper, MD  Electronically Signed     GK/MEDQ  D:  05/10/2006  T:  05/10/2006  Job:  [id number]

## 2011-03-30 NOTE — H&P (Signed)
[name], [name]           ACCOUNT NO.:  [id number]   MEDICAL RECORD NO.:  [id number]          PATIENT TYPE:  INP   LOCATION:  A216                          FACILITY:  [facility]   PHYSICIAN:  Osvaldo Shipper, MD     DATE OF BIRTH:  Sep 01, 1906   DATE OF ADMISSION:  05/05/2006  DATE OF DISCHARGE:  LH                                HISTORY & PHYSICAL   The patient's primary doctor is Dr. Syliva Overman.   She has seen Dr. Katrinka Blazing in the past, who has done a colonoscopy on her.   ADMISSION DIAGNOSES:  1.  Lower gastrointestinal bleed, likely diverticular.  2.  A history of hypertension.  3.  Past history of gastrointestinal bleed.  4.  Borderline diabetes per patient.   CHIEF COMPLAINT:  Passing blood in the stool since this morning.   HISTORY OF PRESENT ILLNESS:  The patient is a 77 year old African-American  female who lives alone, who has hypertension as her only main medical  problem, who was doing well until this morning, when she woke up from sleep  because she had to use the bathroom, and then according to her when she  wiped herself, she noticed blood.  The patient is not a very good historian,  and I am not sure how much bleeding the patient had.  She had these episodes  only once and then showed up in the emergency department.  She denied any  nausea, vomiting, abdominal pain, fever or chills.  She states this is her  third GI bleeding over the past many years.  She said she has been seen by  Dr. Katrinka Blazing in the past and has had a colonoscopy in the past.  According to  the e-Chart, she possibly had a colonoscopy last in August of 2003.   MEDICATIONS AT HOME:  1.  Benicar HCT 40/25, once daily.  2.  Lotrel 10/20, 1 p.o. daily.   ALLERGIES:  No known drug allergies.   PAST MEDICAL HISTORY:  Hypertension, a history of GI bleed in the past, a  history of diverticular disease involving the left colon, a history of  borderline diabetes, for which she is not on any  medication.  The patient  told me that she has never had a surgery, but according to previous  discharge summary she has had a cataract surgery.  She has had  hysterectomies and a partial gastrectomy secondary to bleeding ulcers.  She  has also had a hemorrhoidectomy in the past.   Her other medical problems also include dementia and occasional history of  vertigo.   SOCIAL HISTORY:  Lives about 9 miles from [place].  Lives alone.  She  uses a four-prong cane to ambulate.  Otherwise she is independent.  She does  not smoke.  No alcohol use.  No illicit drug use.   FAMILY HISTORY:  Noncontributory at this point.   REVIEW OF SYSTEMS:  A 10-point review of systems was done.  The difficulty  was unremarkable except as mentioned in the HPI.   PHYSICAL EXAMINATION:  VITAL SIGNS:  In the ED, her temperature was 98,  blood pressure 134/66, heart rate 75, respiratory rate 16.  Saturations were  not recorded.  GENERAL:  A thin, elderly female in no distress, mildly confused at times.  HEENT:  There is no pallor, no icterus.  Oral mucous membranes are moist.  No oral lesions are noted.  LUNGS:  Clear to auscultation bilaterally.  CARDIOVASCULAR:  S1, S2, normal, regular.  No murmurs appreciated.  Some  premature beats are heard.  ABDOMEN:  Soft, nontender, nondistended.  Bowel sounds are present.  No mass  or organomegaly appreciated.  RECTAL:  Examination was deferred, as it was done by the ED physician, which  showed blood in the rectal vault.  EXTREMITIES:  Without edema.  Peripheral pulses are palpable.  No calf  tenderness is present.   LABORATORY DATA:  Her white count was normal.  Hemoglobin 12.1, MCV 83.  Coag profile is normal.  CMP shows glucose 122, BUN 32, creatinine 1.5,  total bilirubin 0.8, alkaline phosphatase 136.  Other LFTs are normal.  No  previous labs are available at this time.   EKG shows sinus rhythm with a normal axis.  Intervals appear to be within  the  normal range except for maybe a mild P-R prolongation.  No Q waves are  identified.  No concerning ST changes are noted on this EKG.  No T waves  changes are noted.  There could be a Q wave in lead V1, though there could  be an upstroke before the downgoing wave.   No imaging studies have been done.   IMPRESSION:  A 77 year old African-American female who has hypertension and  who is remarkably healthy for her age and who lives apparently alone, which  is also quite remarkable.  She presents with possibly one episode of fresh  bright red blood per rectum.  Quantity is possibly trivial, though I am not  getting a good history from her.  This is most likely diverticular or  hemorrhoidal, based on her previous history.  She also has hypertension  which appears to be stable.   PLAN:  1.  Likely lower gastrointestinal bleed.  Monitor CBC q.6h.  If her      hemoglobin drops, she may need an endoscopy.  This is the third episode      in her lifetime with GI bleeds.  If she has a significant bleed, she      will have to be considered for surgery, though I do not think that this      was a significant bleed in any case.  PPI will be given.  She will be      put on clear liquids for now, kept n.p.o. past midnight, and Dr. Katrinka Blazing      will be consulted to see her.  Coags are okay at this time.  We will      hold her antihypertensive agents for today, depending on how      her blood pressure does over the next 12-24 hours.  2.  Deep venous thrombosis prophylaxis with sequential compressive devices.  3.  Further management decisions will depend on initial testing and      patient's response to treatment.      Osvaldo Shipper, MD  Electronically Signed     GK/MEDQ  D:  05/05/2006  T:  05/05/2006  Job:  [id number]   cc:   Milus Mallick. Lodema Hong, M.D.  Fax: [phone]

## 2011-03-30 NOTE — Discharge Summary (Signed)
[name], [name]           ACCOUNT NO.:  [id number]   MEDICAL RECORD NO.:  [id number]          PATIENT TYPE:  INP   LOCATION:  A330                          FACILITY:  [facility]   PHYSICIAN:  Osvaldo Shipper, MD     DATE OF BIRTH:  06/03/1906   DATE OF ADMISSION:  12/27/2006  DATE OF DISCHARGE:  02/20/2008LH                               DISCHARGE SUMMARY   PRIMARY CARE PHYSICIAN:  Milus Mallick. Lodema Hong, M.D.   DISCHARGE DIAGNOSES:  1. Bronchitis versus community-acquired pneumonia, improved.  2. Likely ingrown toenail of the right first toe requiring outpatient      followup.  3. Hypertension, stable.  4. Chronic anemia, stable.  5. Chronic renal insufficiency, stable.  6. Diverticulosis, stable.   Please review H&P dictated at the time of admission for details  regarding patient's presenting illness.   HOSPITAL COURSE:  Briefly, this is a 77 year old African-American female  who lives alone at home but who has home health aide and family members  who provide support.  She presented to the ED complaining of cough with  yellowish expectoration and weakness.  Her x-ray did not clear cut show  pneumonia, but she definitely had yellowish expectoration.  There was  nonspecific density noticed in the right lower lobe of the lung.  The  patient was in any case put on Levaquin every alternate day for renal  function.  She was given steroids for her wheezing and was also given  some nebulizer treatments.  The patient slowly improved.  Her cough has  now resolved.  She is breathing much better and saturating more than 95%  on room air.  Considering this improvement, she is considered stable for  discharge today.  She will continue on her antibiotics for 3 more doses.   Today the patient is complaining of some pain in her right first toe.  This was examined, and there is a possibility that she has an ingrown  toenail.  I do not see any other wounds or ulcerations . She does have  good  peripheral pulses.  We will make an appointment for her with her  podiatrist, Dr. Alona Bene in [place], in the next few days so that this can be  taken care of.  The patient is also requesting a diabetic boot; however,  she just has borderline diabetes, and she is actually not even on any  medications for this.  Hence, she probably does not qualify for a  diabetic good, but I am going to ask her to discuss this with her  podiatrist.   Her other medical issues include hypertension and chronic renal  insufficiency which all remain stable.  She may continue back on Lotrel  and hydrochlorothiazide.  She also has history of diverticular GI bleed  and, as a result, iron-deficiency anemia.  These issues are also stable  at this point.   DISCHARGE MEDICATIONS:  1. Levaquin 750 mg p.o. every other day for 3 more doses to be started      on February 22.  2. Prednisone 20 mg for 3 more days, then 10 mg for 3 more days  starting tomorrow.  3. Albuterol inhaler 2 puffs every 4 hours as needed for wheezing.  4. She will continue with her home medications as before which      includes Lotrel and hydrochlorothiazide.  She is not on the      Benicar/hydrochlorothiazide.  She is also on Nu-Iron which can be      continued.   DIET:  She may resume her previous diet.   ACTIVITY:  She uses a quad cane to ambulate, and she may continue to do  so.   No consultations were obtained during this hospital stay.   Appointment will also be made to follow up with Dr. Alona Bene, her  podiatrist in [place], [place]; with her primary care physician, Dr.  Lodema Hong as needed.   Total time at discharge: 35 minutes.      Osvaldo Shipper, MD  Electronically Signed     GK/MEDQ  D:  01/01/2007  T:  01/01/2007  Job:  [id number]   cc:   Milus Mallick. Lodema Hong, M.D.  Fax: [phone]

## 2011-03-30 NOTE — Discharge Summary (Signed)
[name], [name]           ACCOUNT NO.:  [id number]   MEDICAL RECORD NO.:  [id number]          PATIENT TYPE:  INP   LOCATION:  A226                          FACILITY:  [facility]   PHYSICIAN:  Catalina Pizza, M.D.        DATE OF BIRTH:  [birth year]   DATE OF ADMISSION:  09/30/2006  DATE OF DISCHARGE:  11/23/2007LH                               DISCHARGE SUMMARY   DISCHARGE DIAGNOSES:  1. Diverticular bleed.  2. Anemia secondary to #1.  3. Hypertension.   DISCHARGE MEDICATIONS:  1. Amlodipine 10 mg p.o. daily.  2. Lotrel 10/20 p.o. daily.  3. Benicar HCT 40 mg/25 mg p.o. daily.  4. Nu-Iron 150 mg p.o. daily with food.   HOSPITAL COURSE:  The patient is a 77 year old, African-American female  who stated that she had some abdominal cramping and noticed acute blood  in her stools, bright red in nature.  She did have history of GI bleed  before of unknown source and exact unknown at that time and was admitted  for further evaluation.  She was seen in consultation by Dr. Karilyn Cota and  felt that it was very likely that it was diverticular bleed and many  times self-limited and suggested following with routine H&H's and clear  diet then advance as tolerated.  During hospitalization, she did not  have any further signs of GI bleed.   DISCHARGE PHYSICAL EXAMINATION:  VITAL SIGNS:  Temperature is 97.4,  blood pressures ranging from 118/55 to 148/64, heart rate is 66-73,  respirations 19. GENERAL:  This is an elderly, African female lying in  bed in no acute distress.  HEENT:  Unremarkable.  No JVD, no thyromegaly.  LUNGS:  Lungs were clear to auscultation bilaterally.  HEART:  Regular rate and rhythm.  ABDOMEN:  Soft, nontender, nondistended, positive bowel sounds.  No  masses appreciated.  EXTREMITIES:  No lower extremity.  Palpable pulses in lower extremities  bilaterally.  NEUROLOGIC:  The patient is alert and oriented x3 and has no deficits at  this time.   LABORATORY DATA AND X-RAY  FINDINGS:  Initial CBC showed hemoglobin of 11  and trended down as low as 9.9, but day before discharge, hemoglobin was  remaining stable at 10.3.  A BMET at the time of discharge shows sodium  136, potassium 3.7, chloride 109, CO2 23, glucose 105, BUN 20,  creatinine 1.2, calcium of 8.3.  Vitamin B12 level was 436.  Ferritin  level was 17.  Folate level was 13.2.  Iron and total iron binding  capacity with iron 36% and saturation was 10.  Images obtained during  hospitalization were none.   DISPOSITION:  The patient did well with routine monitoring and the  difficulty was the patient did not have a sitter at home due to the  holidays and her foster daughter was requesting that she go to a nursing  facility, but the patient was adamant about not wanting to do this.  Apparently, her foster daughter came and picked her up and took her  home.  She will have home health to come out and see the patient  and  needs to follow up with Dr. Lodema Hong in approximately 2 weeks for a  recheck of her hemoglobin and likely a BMET to make sure that her kidney  functions remain normal.      Catalina Pizza, M.D.  Electronically Signed     ZH/MEDQ  D:  10/04/2006  T:  10/04/2006  Job:  [id number]

## 2011-03-30 NOTE — Discharge Summary (Signed)
NAME:  [name], [name]                     ACCOUNT NO.:  [id number]   MEDICAL RECORD NO.:  [id number]                   PATIENT TYPE:  INP   LOCATION:  A318                                 FACILITY:  [facility]   PHYSICIAN:  Milus Mallick. Lodema Hong, M.D.           DATE OF BIRTH:  June 22, 1906   DATE OF ADMISSION:  05/26/2002  DATE OF DISCHARGE:  06/03/2002                                 DISCHARGE SUMMARY   DISCHARGE DIAGNOSIS:  Acute lower gastrointestinal bleed secondary to  diverticular disease, affecting the left colon to the splenic flexure.   OTHER DIAGNOSES:  1. Hypertension.  2. Degenerative joint disease.   SUMMARY:  The patient is a 77 year old African-American female who presented  to the emergency room on the day of her admission with a history of bright  red rectal bleeding which started eight hours prior to her admission.  She  denied any syncope or lightheadedness.  She denied any constipation or  straining at stools.  She was admitted and April 2002 with a similar  presentation and at that time found to have diverticular disease.   PAST MEDICAL HISTORY:  Significant for hypertension, degenerative joint  disease, diverticular disease.  She has a history of a syncopal episode  secondary to overdose of antihypertensive medication.  She does have mild  dementia and a history of occasional vertigo.  She has had surgery on a left  cataract and she has had a hysterectomy about 20 years ago.  She has also  had a partial gastrectomy secondary to bleeding ulcers about 30 years ago,  and her right cataract was removed; she does have a left cataract present.   ALLERGIES STATED:  None known.   MEDICATIONS AT TIME OF ADMISSION:  1. Avalide 300/12.5 one q.d.  2. Mavik 2 mg q.d.   PHYSICAL EXAMINATION ON ADMISSION:  GENERAL:  She was an elderly female,  alert and oriented x4.  VITAL SIGNS:  Temperature 97.1, heart rate 73, respirations 20, blood  pressure 160/87.  HEENT:   __________ , pale mucosa.  NECK:  Supple.  No adenopathy, no JVD, no bruits.  CHEST:  Clear to auscultation.  No crackles or wheezes are heard.  CARDIOVASCULAR:  Heart sounds 1 and 2, no murmurs, no S3.  ABDOMEN:  Soft with no organomegaly nor masses, was nontender.  RECTAL  Revealed bulge at rectal vault.  EXTREMITIES:  Negative for edema.   LABORATORY DATA ON ADMISSION:  Hemoglobin 12.8, white cell count 7.1,  platelet count 216.  Chemistry on admission:  Sodium 136, potassium 4.1,  chloride 102, CO2 28, BUN 21, creatinine 1.2, glucose 121, calcium 8.6,  total protein 6.4,  albumin 3.8, AST 22, ALT 14, alkaline phosphatase 93,  total bili 1.1, direct bili 0.1, indirect bilirubin 1.0.   HOSPITAL COURSE:  The patient was admitted to a medical floor, kept on a  cardiac monitor, and had fluid resuscitation with intravenous normal saline  and close  monitoring of her hemoglobin and hematocrit.  She was kept nil  orally and typed and crossed for four units of packed red cells.  She did  require a transfusion.  Her hemoglobin dropped to 8.5 by May 27, 2002.  Surgery was consulted and became involved in her care through the admission.  On June 01, 2002 the patient did have a colonoscopy which revealed a dense  collection of diverticular disease affecting the left colon only.  At that  time no active bleeding was seen.  Post the procedure he recommended that if  the patient represented she would need an extended left colectomy to the  splenic flexure to remove all of the diverticulosis.  No diverticula were  noted in the transverse or right colon.   Cardiovascular system:  The patient's cardiovascular system remained stable.  She had no decompensation with hypotension or tachycardia.  She had no  complaint of chest pain.   Gastrointestinal system:  The patient had several episodes of the GI  bleeding while in hospital.  However, we were able to resuscitate her with  blood transfusion of  packed red cells.  She did not require any surgical  procedure during this hospitalization but it should be noted that if she  represents she may well merit having left colectomy.  She was placed on  Carafate and Pepcid for chronic history of peptic ulcer disease.   Central nervous system:  Neurologically the patient remained intact.  She  did not show any signs or symptoms of ongoing dementia.   Infectious disease:  There was no evidence of any infection during the  hospitalization.  The patient's hospital course was fairly stable.  Although  she did have active bleeding, she at no time decompensated and she did not  require surgery.  She was discharged home stable with no evidence of current  bleeding, with arrangements for home health to monitor her on a regular  basis with her blood pressure and for any evidence of bleeding.  On the day  of discharge her hemoglobin was 11.2 with a white cell count 5.4, platelet  count 120.   MEDICATIONS AT TIME OF DISCHARGE:  1. Carafate slurry 1 g before meal time and at bedtime.  2. Aciphex 20 mg daily.  3. Catapres-TTS-1 to be changed every fourth day.  4. ECA 325 mg q.d.  5. Potassium 10 mEq b.i.d.   FOLLOW-UP:  She will follow up with her primary M.D. two weeks post  discharge.                                                Milus Mallick. Lodema Hong, M.D.    MES/MEDQ  D:  06/24/2002  T:  06/29/2002  Job:  [id number]

## 2011-03-30 NOTE — H&P (Signed)
NAME:  [name], [name]                     ACCOUNT NO.:  [id number]   MEDICAL RECORD NO.:  [id number]                   PATIENT TYPE:  EMS   LOCATION:  ED                                   FACILITY:  [facility]   PHYSICIAN:  Dirk Dress. Katrinka Blazing, M.D.                DATE OF BIRTH:  July 29, 1906   DATE OF ADMISSION:  09/26/2002  DATE OF DISCHARGE:                                HISTORY & PHYSICAL   HISTORY OF PRESENT ILLNESS:  This 77 year old female admitted for evaluation  of a syncopal episode. The patient states that she has been feeling quite  well, and she was doing well when she awakened this morning.  She walked to  her mailbox to mail a letter.  On her way back to her house, she states that  she passed out.  She relates quite clearly that she passed completely out.  When she came to and tried to get up, she realized that she could not get  up.  She was seen by a neighbor who assisted her back to the house and  called EMS.  EMS evaluation revealed her to be stable on initial encounter,  and she remained stable during transfer.  While in the emergency room, her  telemetry showed multiple PACs and PVCs but a stable rate.  She denies chest  pain, shortness of breath, palpitations, flutters, or any type of chest  discomfort.  She denies headache, blurred vision, change in visual fields.  She was not having any problems with her stool or urine.  She has not had  any weakness of her arms or legs.  She has had no change in her speech.  The  patient did not appear to be postictal.  Because of a complete syncopal  episode, she was admitted for observation.  CT of the head was normal, and  EKG only showed multiple PVCs and PACs.   PAST MEDICAL HISTORY:  She has a history of prior syncopal episode without  any etiology determined.  She has a history of recurrent GI bleeding due to  diverticulosis with the last major bleed requiring admission in July of  2003.  She has anemia, probably due to  major bleeds.  She also has  hypertension, mild renal insufficiency, and mild senile dementia.   PAST SURGICAL HISTORY:  Includes hemorrhoidectomy, bilateral cataract  extraction, with intraocular lens implants, partial gastrectomy.   MEDICATIONS:  Lotrel 5/10 q.d., hydrochlorothiazide 25 mg q.d., AcipHex 20  mg q.d., and enteric coated aspirin 1 q.d.   SOCIAL HISTORY:  The patient lives alone and is independent of her  activities of daily living.   PHYSICAL EXAMINATION:  GENERAL:  She is a small-framed, alert, elderly  female who looks much younger than her stated age, and she is much more  vigorous than her age would suggest.  VITAL SIGNS:  Blood pressure 147/74, pulse 82, respirations 20, temperature  97.1.  Oxygen  saturation on room air is 98%.  HEENT:  There is no evidence of head or face trauma.  She has operative  changes of her eyes with lens implants.  She has moist mucosa.  There are  multiple missing teeth.  Her gag reflex is intact.  Tongue in the midline.  There is no facial asymmetry.  NECK:  Her neck is supple. She does not have bruits.  There is no jugular  venous distention, thyromegaly, or adenopathy.  CHEST:  A few fine basilar rales, slightly more prominent on the right but  at the base.  No rhonchi or wheezes.  Excellent air movement bilaterally.  HEART:  Irregular rhythm with multiple ventricular premature beats.  Stable  rate without evidence of tachycardia.  No major murmur.  ABDOMEN:  Soft, benign, nontender.  No masses.  No bruit.  Normal bowel  sounds.  No pulsatile masses.  EXTREMITIES:  Thin extremities with full peripheral pulses.  No major joint  deformity.  No peripheral edema.  No cyanosis or clubbing.  NEUROLOGICAL:  She is fully alert.  She is alert to person, place, and time.  Cranial nerves are intact, II-XII.  She has no sensory or motor deficit.  There is no cerebellar deficit.  She sits independently.  She can stand  independently.  Romberg is  negative.  Deep tendon reflexes are intact.   IMPRESSION:  1. Acute syncopal episode, etiology to be determined.  2. Hypertension.  3. Diverticulosis with history of recurrent bleeding.  4. Chronic anemia, mild.  5. Chronic renal insufficiency, mild.  6. Mild senile dementia.   PLAN:  The patient will be admitted and monitored.  Will follow her cardiac  rhythm to make sure she does not have a bradycardia or an SVT or possibly a  ventricular tachycardia but will follow up with cardiac enzymes and repeat  EKGs.  Will get carotid Doppler studies for completion, and if she remains  stable after 24 hours, will start progressive ambulation with plans for  discharge home.                                               Dirk Dress. Katrinka Blazing, M.D.    LCS/MEDQ  D:  09/26/2002  T:  09/26/2002  Job:  [id number]

## 2011-03-30 NOTE — H&P (Signed)
NAMEMARCHETA, [name]           ACCOUNT NO.:  [id number]   MEDICAL RECORD NO.:  [id number]          PATIENT TYPE:  INP   LOCATION:  A328                          FACILITY:  [facility]   PHYSICIAN:  Osvaldo Shipper, MD     DATE OF BIRTH:  [birth year]   DATE OF ADMISSION:  12/27/2006  DATE OF DISCHARGE:  LH                              HISTORY & PHYSICAL   PRIMARY CARE PHYSICIAN:  Dr. Milus Mallick. Simpson.   ADMISSION DIAGNOSES:  1. Possible left-sided community acquired pneumonia.  2. Generalized weakness from #1.  3. History of hypertension.  4. History of iron-deficiency anemia with diverticular bleeds in the      past.   CHIEF COMPLAINT:  Weakness and cough for a week.   HISTORY OF PRESENT ILLNESS:  The patient is a 77 year old African-  American female who was last admitted back in November of 2007 for a  diverticular bleed.  She presented top the ED today after being sent in  by her primary care physician for cough for a week and generalized  weakness.   According to the patient, her caregiver had been complaining of flu-like  symptoms and she thinks that she may have passed the infection to her.  Over the past week she has been having cough with yellowish  expectoration.  The patient absolutely denies any chest pain or  shortness of breath. She has not noticed any fever, however, does have  some chills.   No nausea, vomiting. Her appetite has been somewhat poor but she has  been forcing fluids.   MEDICATIONS AT HOME:  1. Lotrel 10/20 one tablet daily.  2. Benicar HCT 40/25 one tablet daily.  3. Nu-Iron 150 mg daily (she is possibly not taking the Nu-Iron      consistently).   ALLERGIES:  No known drug allergies.   PAST MEDICAL HISTORY:  1. Hypertension.  2. History of a diverticular GI bleed in the past. Last episode was      November of 2007. She had a colonoscopy in June of 2007 that      apparently was done by Dr. Katrinka Blazing however I do not have the report      in the  computer.  3. History of partial gastrectomy secondary to bleeding ulcers.  4. History of hemorrhoidectomy in the past.  5. Cataract surgeries.  6. Mild dementia.  7. Chronic renal insufficiency.   SOCIAL HISTORY:  She lives alone but has an aid who helps her for the  majority of the day time. She lives about 8 miles from [place]. She  ambulates using a 4-prong cane and she is quite independent. No alcohol  use. No smoking. No illicit drug use.   FAMILY HISTORY:  Noncontributory.   REVIEW OF SYSTEMS:  GENERAL - positive for malaise.  HEENT -  unremarkable. GI - unremarkable at this time. GU unremarkable.  RESPIRATORY - see HPI. CARDIOVASCULAR - unremarkable.  ENDOCRINE -  unremarkable. NEUROLOGIC - unremarkable.  All other systems  unremarkable.   PHYSICAL EXAMINATION:  VITAL SIGNS: Temperature is 98.5, blood pressure  141/71, heart rate 68, respiratory rate is  18, saturation 96%, unclear  if this is on room air or on oxygen.  GENERAL EXAM: This is an elderly female who appears to be weak but in no  distress.  HEENT: There is no apparent icterus. Oral mucous membranes are slightly  dry, no oral lesions are noted.  LUNGS: Reveal rhonchi bilaterally, no active wheezing is appreciated. I  do not appreciate any crackles, though there is more rhonchi at the left  base.  CARDIOVASCULAR: S1, S2 normal, regular. No murmur is appreciated.  ABDOMEN: Soft, nontender, nondistended, bowel sounds are present. No  mass or organomegaly is appreciated.  EXTREMITIES: Florid edema. Peripheral pulses are palpable. There is a  callous formation on the right plantar aspect of the right foot.  NEUROLOGIC: The patient is alert and oriented x3, no focal neurologic  deficits are present.   LABORATORY DATA:  White count is 2.5 thousand, she does have  neutropenia, she has about 700 neutrophils, 26%. Lymphocytes 63%.  Hemoglobin is 10.4 which is close to her baseline. MCV 77. Platelet  count 205,000.  BMET shows a glucose of 132, BUN is 18, creatinine 1.3  which is probably close to her baseline.  Blood cultures are pending. She did have a chest x-ray which suggested  possible pneumonia in the left base.   ASSESSMENT:  This is a 77 year old African-American female who has  hypertension, who is otherwise pretty independent, has a history of GI  bleeding in the past and who presents with weakness and cough and  possibly has a pneumonia on the left side. She gives history of exposure  to someone with possible flu like symptoms.  Her other medical issues  are stable.   PLAN:  1. Possible left-sided community acquired pneumonia. Will treat her      with Levaquin and follow-up on the blood cultures. She will have      nebulizer treatments every 6 hours for now. She does not have much      wheezing though. Physical therapy will follow this patient while      she is in the hospital.  2. History of hypertension. Continue her Lotrel, however will hold      back on her Benicar HCT for now until she appears to be more      stable.  3. Anemia likely from iron deficiency. Continue Nu-Iron. She does have      history of diverticular bleed but has not noticed any blood in her      stools recently. There is no evidence for any active bleeding at      this time.  4. DVT/GI prophylaxis will be initiated.  5. Leukopenia. It is likely from infection. We will continue to      monitor her counts closely. No indication for Neupogen at this      point. This is likely from infection and hence antibiotics should      take care of this.  6. Chronic renal insufficiency, stable. Will give gentle hydration of      80 cc for one liter and then change it to [facility].  7. The patient is a full code at this time.      Osvaldo Shipper, MD  Electronically Signed     GK/MEDQ  D:  12/27/2006  T:  12/27/2006  Job:  [id number]   cc:   Milus Mallick. Lodema Hong, M.D. Fax: [phone]

## 2011-03-30 NOTE — H&P (Signed)
[name], [name]           ACCOUNT NO.:  [id number]   MEDICAL RECORD NO.:  [id number]          PATIENT TYPE:  OBV   LOCATION:  A226                          FACILITY:  [facility]   PHYSICIAN:  Osvaldo Shipper, MD     DATE OF BIRTH:  1906-04-30   DATE OF ADMISSION:  09/30/2006  DATE OF DISCHARGE:  LH                              HISTORY & PHYSICAL   PRIMARY CARE PHYSICIAN:  Milus Mallick. Lodema Hong, M.D.   ADMISSION DIAGNOSES:  1. Lower gastrointestinal bleed, likely diverticular.  2. History of lower gastrointestinal diverticular bleeds in the past.  3. Hypertension.   CHIEF COMPLAINT:  Blood per rectum.   HISTORY OF PRESENT ILLNESS:  The patient is a 77 year old African-  American female who is a pretty healthy and active person for her age  and who lives alone but has an aide who comes in and stays with her most  of the time.  According to the patient, her aide was feeling sick for  the past few days, and she is not sure how this is related to her  current symptoms, but she thinks it may have contributed somewhat.  Yesterday morning, the patient woke up, felt some abdominal cramping,  and then the next thing she noted was some blood in her stool.  She was  initially passing just blood, and then it was mixed with her stools.  She had a few episodes yesterday; she is unable to tell me exactly how  many episodes.  This morning she had a little bit of blood in her bowel  movement, but then after that, she has not noticed any more bleeding.  She did not feel dizzy or lightheaded, no nausea, vomiting, abdominal  pain.  She went to her doctor's office today and was sent over to the  ED.   MEDICATIONS AT HOME:  1. Antihypertensive pills.  2. Lotrel 10/20 once a day.  3. Benicar 40 mg once a day.   ALLERGIES:  No known drug allergies.   SOCIAL HISTORY:  The patient lives alone but has an aide who stays with  her for a majority of the time, lives about 8 miles from [place].  She  uses a 4-prong cane to ambulate; otherwise she is quite independent.  No alcohol use, no smoking, no illicit drug use.   FAMILY HISTORY:  Noncontributory at this point.   PAST MEDICAL HISTORY:  Significant for:  1. Hypertension.  2. History of GI bleeding in the past.  3. History of diverticulosis involving the left colon.  Her last      colonoscopy was in June 2007 when she last presented with a GI      bleed. The report of this is not available in the electronic chart.      This was done by Dr. Katrinka Blazing in day surgery.  4. She also has a history of partial gastrectomy secondary to bleeding      ulcers.  5. History of hemorrhoidectomy in the past.  6. She has also had cataract surgery in the past.  7. History of chronic renal insufficiency.  8. Maybe even  mild dementia in the past.   REVIEW OF SYSTEMS:  GENERAL:  Unremarkable.  HEENT: Unremarkable.  GI:  Please see HPI. GU: Unremarkable. RESPIRATORY: Unremarkable.  CARDIOVASCULAR: Unremarkable.  ENDOCRINE: Unremarkable.  NEUROLOGIC:  Unremarkable.   PHYSICAL EXAMINATION:  VITAL SIGNS: Temperature 97.7, blood pressure  128/70, heart rate 66, respiratory rate 16, saturating 98% on room air.  GENERAL: An elderly black female, very pleasant to talk to, in no  distress.  HEENT:  There is no pallor or icterus.  Oral mucous membranes moist.  No  oral lesions are noted.  There is slight swelling over her right cheek  area.  No lesions seen inside her oral cavity, no erythema or warmth  felt over the right cheek.  NECK:  Soft and supple.  No thyromegaly is appreciated.  LUNGS:  Clear to auscultation bilaterally.  CARDIOVASCULAR:  S1, S2 normal and regular.  No murmurs are appreciated.  ABDOMEN:  Soft, nontender, nondistended.  Bowel sounds are present.  No  mass or organomegaly appreciated.  LOWER EXTREMITIES:  No edema.  Peripheral pulses poor but palpable.  RECTAL:  Done by ED physician and showed blood in the vault.  I did not  do a  rectal exam myself.   LABORATORY DATA:  White count normal. Hemoglobin 11.0, was between 11  and 12 a few months ago when she was here.  Platelet count 226.  PT  15.4.  INR and PTT normal.  BMET shows a BUN of 29, creatinine 1.6.  No  other labs available.   IMPRESSION:  This is a 77 year old African-American female with  hypertension and history of diverticular GI bleed who presents with  bright red blood per rectum.  Most likely etiology is diverticular  bleed.  I do not have the full report of the colonoscopy done in June  2007.  I have requested the same.  Upper gastrointestinal bleed is very  less likely in this individual.   PLAN:  1. Gastrointestinal bleed:  Will have GI see her and repeat her      hemoglobin and hematocrit tonight and tomorrow.  Transfuse her as      needed.  Give her PPI.  Give just clear liquid diet for now and see      how she does.  Depending on her hemoglobin and hematocrit, further      disposition will be planned.  2. Hypertension.  I will withhold her antihypertensives for now until      we are sure that she remains hemodynamically stable.  I think this      may be resumed once she is ready to go home.  3. Deep vein thrombosis prophylaxis with sequential compressive      devices.  4. Further management decisions will be per results of initial testing      and patient's response to treatment.  5. Please also note that the patient also has chronic renal      insufficiency.  There seems to be mild exacerbation of the same.      We will give her gentle IV hydration and then continue to follow      her renal function.      Osvaldo Shipper, MD  Electronically Signed     GK/MEDQ  D:  09/30/2006  T:  09/30/2006  Job:  [id number]   cc:   Milus Mallick. Lodema Hong, M.D.  Fax: [phone]   Lionel December, M.D.  [address]  [place]  [place] [address]

## 2011-03-30 NOTE — Consult Note (Signed)
NAMEMASON, [name]           ACCOUNT NO.:  [id number]   MEDICAL RECORD NO.:  [id number]          PATIENT TYPE:  OBV   LOCATION:  A226                          FACILITY:  [facility]   PHYSICIAN:  Lionel December, M.D.    DATE OF BIRTH:  August 22, 1906   DATE OF CONSULTATION:  09/30/2006  DATE OF DISCHARGE:                                   CONSULTATION   REASON FOR CONSULTATION:  Rectal bleeding.   HISTORY OF PRESENT ILLNESS:  Ms. Kilgore is a 77 year old African-  American female who states yesterday morning she awakened and went to go to  her poddy chair.  She noticed a dark burgundy stool with blood clots in  the stool as well as in the commode.  She says it was approximately a nickel-  size amount of bleeding.  She had another episode again this morning before  she came to the hospital.  She denies any abdominal pain, proctalgia, fever,  chills.  She does complain of weakness.  Denies any nausea, vomiting, denies  heartburn or indigestion, dysphagia, odynophagia.  Her weight has remained  stable. Her appetite is good.  Hemoglobin 11, hematocrit 32.7, platelet  count 226, and white blood cell count 4.4.   PAST MEDICAL HISTORY:  1. History of lower GI bleed requiring transfusion in June 2007.  She had      colonoscopy with Dr.  Katrinka Blazing which showed old blood and multiple      diverticula.  2. Hypertension.  3. Remote history of peptic ulcer disease with possible partial      gastrectomy approximately 30 years ago.  She does not have many details      regarding this.  4. Hemorrhoidectomy.  5. Borderline diabetes mellitus.   MEDICATIONS PRIOR TO ADMISSION:  1. Lotrel 10/20 mg daily.  2. Benicar/hydrochlorothiazide 40/25 mg daily.  3. Aspirin 325 mg p.r.n.   ALLERGIES:  No known drug allergies   FAMILY HISTORY:  There is no known family history of colorectal carcinoma or  chronic GI problems.   SOCIAL HISTORY:  Ms. Palla is a widow.  She lives alone.  She has  several  foster children.  She denies tobacco, alcohol, or drug use.   REVIEW OF SYSTEMS:  CONSTITUTIONAL:  See HPI.  CARDIOVASCULAR: Denies chest  pain, palpitations.  PULMONARY:  She denies shortness of breath, cough,  hemoptysis.  GI:  See HPI.  GU: Denies dysuria, hematuria, frequency.   PHYSICAL EXAMINATION:  VITAL SIGNS: Weight 114 .3 pounds.  Temperature 97.1,  pulse 69, respirations 20, blood pressure 147/91.  GENERAL:  Ms. Beadles is an elderly African-American female who is alert  and oriented, pleasant, cooperative, in no acute distress.  HEENT: Sclerae clear.  Nonicteric.  Conjunctivae pink.  Oropharynx pink and  moist without lesions.  NECK:  Supple without mass or thyromegaly.  HEART: Regular rate and rhythm.  Normal S1, S2 without murmur, gallop, or  rub.  LUNGS:  Clear to auscultation bilaterally.  ABDOMEN:  Positive bowel sounds x4.  No bruits auscultated.  Soft,  nontender, without palpable mass or hepatosplenomegaly.  No rebound  tenderness or guarding.  RECTAL:  No external lesions visualized.  EXTREMITIES: Without clubbing or edema bilaterally.  SKIN:  Warm and dry without rash or jaundice.   LABORATORY DATA:  PT 15.4, INR 1.2, PTT 27.  Calcium 9, sodium 137,  potassium 4.3, chloride 103, CO2 26, BUN 29, creatinine 1.6, glucose 107.   IMPRESSION:  Ms. Leyba is a 77 year old African-American female with  rectal bleeding, small to moderate in volume with 2 episodes.  She has had  no further bleeding since she was hospitalized.  She has a history of  diverticular bleeding with last episode in June 2007.  She was previously  followed by Dr. Katrinka Blazing.  I suspect she could have recurrent diverticular  bleed versus ischemia versus benign anorectal source.  I cannot rule out  rapid transit upper gastrointestinal bleed given history of remote peptic  ulcer disease with hemorrhage; however, she denies any upper  gastrointestinal complaints at this time.  Hemoglobin 11 today.   Would  manage conservatively given colonoscopy earlier this year.  If she has a  significant drop in hemoglobin or perfuse bleeding, she will require further  evaluation at that time.   PLAN:  1. Bed rest.  2. Clear liquid diet.  3. Follow hemoglobin and hematocrit.  If significant drop, will consider      flexible sigmoidoscopy and will discuss further with Dr. Karilyn Cota.  4. Agree with PCI daily.   We would like to thank the [facility] team for allowing [place] to participate in  the care of Ms. Clinton Sawyer.      Nicholas Lose, N.P.      Lionel December, M.D.  Electronically Signed    KC/MEDQ  D:  09/30/2006  T:  09/30/2006  Job:  [id number]

## 2011-03-30 NOTE — Discharge Summary (Signed)
[name], [name]           ACCOUNT NO.:  [id number]   MEDICAL RECORD NO.:  [id number]          PATIENT TYPE:  INP   LOCATION:  A301                          FACILITY:  [facility]   PHYSICIAN:  Renato Battles, M.D.     DATE OF BIRTH:  01-09-1906   DATE OF ADMISSION:  05/05/2006  DATE OF DISCHARGE:  07/03/2007LH                                 DISCHARGE SUMMARY   DISCHARGE DIAGNOSES:  1.  Lower gastrointestinal bleed, resolved.  2.  Acute renal failure, resolved.  3.  Hypertension.  4.  Generalized weakness and debilitation secondary to advanced age.   DISCHARGE MEDICATIONS:  1.  Benicar HCT 40/25 one tablet daily.  2.  Lotrel 10/20 one tablet daily.   CONSULTATIONS:  Dirk Dress. Katrinka Blazing, M.D.   PROCEDURE:  1.  Packed red blood cells transfusion.  2.  Colonoscopy on May 09, 2006 showing old blood and multiple diverticula      but no fresh blood or clots.   HISTORY OF PRESENT ILLNESS AND HOSPITAL COURSE:  The patient is a very  pleasant 77 year old African American female who has been quite independent  at home.  She presented with generalized weakness and bright red blood per  rectum.  No other complaints.  Physical exam was not impressive.  Initial  workup showed acute renal failure with a BUN of 32 and creatinine of 1.5.  Electrolytes were normal.  Hemoglobin was 12.  She appeared slightly  dehydrated.  She was admitted to the hospital and given some IV fluids.  Dr.  Elpidio Anis was consulted.  Her hemoglobin did drop with IV fluids.  Her  renal insufficiency recovered.  She got colonoscopy.  The next few days we  were balancing the amount of fluids required to maintain good renal function  with the dilutional effect on her hemoglobin; however, the last two days of  her hospital stay, her hemoglobin level was stable.  Her renal function was  normal.  She reported no dizziness or lightheadedness.  Her __________ went  back to baseline, although she did ask for a wheelchair to help  her when she  is feeling weak.   DISCHARGE DIET:  Heart healthy.   DISCHARGE ACTIVITY:  As tolerated.   FOLLOW UP:  She is to follow up with her primary care doctor, Dr. Syliva Overman, in 7 to 10 days and Dr. Katrinka Blazing in two weeks.   DISPOSITION:  Home with home health.      Renato Battles, M.D.  Electronically Signed     SA/MEDQ  D:  05/14/2006  T:  05/14/2006  Job:  [id number]   cc:   Milus Mallick. Lodema Hong, M.D.  Fax: [phone]   Dirk Dress. Katrinka Blazing, M.D.  Fax: [phone]

## 2011-03-30 NOTE — Group Therapy Note (Signed)
[name], [name]           ACCOUNT NO.:  [id number]   MEDICAL RECORD NO.:  [id number]          PATIENT TYPE:  INP   LOCATION:  A216                          FACILITY:  [facility]   PHYSICIAN:  Margaretmary Dys, M.D.DATE OF BIRTH:  12/06/1905   DATE OF PROCEDURE:  05/11/2006  DATE OF DISCHARGE:                                   PROGRESS NOTE   SUBJECTIVE:  The patient continues to feel well.  She had a large bowel  movement today with a tiny bit of blood and melena.  She denies any chest  pain or shortness of breath, but does have some light-headedness.  She feels  comfortable overall.   OBJECTIVE:  GENERAL:  She is comfortable, well-oriented in time, place, and  person.  VITAL SIGNS:  Blood pressure 117/54, pulse 72, respirations 20, temperature  97.9.  HEENT:  Normocephalic, atraumatic.  Oral mucosa was moist.  No exudates.  NECK:  Supple.  No JVD, no lymphadenopathy.  LUNGS:  Clear clinically.  HEART:  S1/S2 regular.  ABDOMEN:  Soft, nontender.  Bowel sounds positive.  No mass palpable.  EXTREMITIES:  No pitting or pedal edema.   LABORATORY DATA:  White blood cell count was 8, hemoglobin 12, hematocrit  36.1, platelet count was 220 with no left shift.   ASSESSMENT/PLAN:  Gastrointestinal bleed secondary to multiple diverticular  disease in the ascending transverse and sigmoid colon.  The patient was  noted to have old blood throughout with no fresh blood.  The patient did  have a tiny bit of fresh blood today and some melena.  Plan is to observe  her for another day.  H&H is very stable and I think she may be able to go  home tomorrow.   DISPOSITION:  Overall the patient is looking pretty good.  She is stable  hemodynamically , had some bowel movement today with some blood in it.  We  will observe her for another 24 hours.  Her vitals are stable.  The patient  will likely be discharged home tomorrow.      Margaretmary Dys, M.D.  Electronically Signed     AM/MEDQ  D:  05/11/2006  T:  05/11/2006  Job:  [id number]

## 2011-03-30 NOTE — Telephone Encounter (Signed)
Called and asked her if her leg was still hurting and she said it was some better but thought she needed one more round of the meds you gave her. I know [name] is checking in on her but I didn't know if you wanted to give her anything else since she was having problems with her leg still.

## 2011-04-01 NOTE — Telephone Encounter (Signed)
pls verify with the nurse seeing her what med is being requested, and if it is an antibiotic, I want a message re the impression by the nurse as to rsidual infection in the area,pls let me know

## 2011-04-02 ENCOUNTER — Encounter: Payer: Self-pay | Admitting: Family Medicine

## 2011-04-02 ENCOUNTER — Other Ambulatory Visit: Payer: Self-pay | Admitting: Family Medicine

## 2011-04-02 DIAGNOSIS — L989 Disorder of the skin and subcutaneous tissue, unspecified: Secondary | ICD-10-CM

## 2011-04-02 NOTE — Telephone Encounter (Signed)
[name] saw her Friday and said that the area on her right leg looked no different than it did when she came in for OV. Although there was no redness or drainage noted. Patient still c/o of pain from area.

## 2011-04-02 NOTE — Telephone Encounter (Signed)
Faxed order to [facility] for [name] to evaluate and call office

## 2011-04-02 NOTE — Telephone Encounter (Signed)
I recommend she have a dermatologist look at this, and am entering a referral, pls let pt and nurse know

## 2011-04-04 NOTE — Telephone Encounter (Signed)
[name] aware and will discuss with her today

## 2011-05-07 ENCOUNTER — Ambulatory Visit (INDEPENDENT_AMBULATORY_CARE_PROVIDER_SITE_OTHER): Payer: Medicare Other | Admitting: Family Medicine

## 2011-05-07 ENCOUNTER — Telehealth: Payer: Self-pay | Admitting: Family Medicine

## 2011-05-07 ENCOUNTER — Encounter: Payer: Self-pay | Admitting: Family Medicine

## 2011-05-07 DIAGNOSIS — N3090 Cystitis, unspecified without hematuria: Secondary | ICD-10-CM

## 2011-05-07 LAB — POCT URINALYSIS DIPSTICK
Blood, UA: NEGATIVE
Nitrite, UA: NEGATIVE
Urobilinogen, UA: 0.2
pH, UA: 5.5

## 2011-05-07 MED ORDER — CIPROFLOXACIN HCL 500 MG PO TABS: 500.0000 mg | ORAL_TABLET | Freq: Two times a day (BID) | ORAL | Status: AC

## 2011-05-07 NOTE — Telephone Encounter (Signed)
Should patient come in for CCUA? This may be difficult for her.

## 2011-05-07 NOTE — Telephone Encounter (Signed)
If she cannot get in  To give the  the specimen it  can be collected at home and sent to the lab for ccua and c/s , best suggestion, I need data before med can be prescribed.( I thought she had relocated)

## 2011-05-07 NOTE — Progress Notes (Signed)
  Subjective:    Patient ID: [name], female    DOB: 06-18-1906, 77 y.o.   MRN: [id number]  HPI    Review of Systems     Objective:   Physical Exam        Assessment & Plan:  Pt appears to have uti, specimen to go for culture , antibiotics prescribed

## 2011-05-07 NOTE — Telephone Encounter (Signed)
Patient in office for nurse visit

## 2011-05-10 LAB — URINE CULTURE

## 2011-05-28 ENCOUNTER — Telehealth: Payer: Self-pay | Admitting: Family Medicine

## 2011-05-28 MED ORDER — ALPRAZOLAM 0.25 MG PO TABS: 0.2500 mg | ORAL_TABLET | Freq: Every evening | ORAL | Status: DC | PRN

## 2011-05-28 MED ORDER — AMLODIPINE BESY-BENAZEPRIL HCL 10-20 MG PO CAPS: 1.0000 | ORAL_CAPSULE | Freq: Every day | ORAL | Status: DC

## 2011-05-28 NOTE — Telephone Encounter (Signed)
Niece will be taking patient out of state to live with her in [place], niece states it will be this week.

## 2011-05-28 NOTE — Telephone Encounter (Signed)
Notd, scripts for 1 month of amlodipine and xanax were signed today

## 2011-06-22 ENCOUNTER — Telehealth: Payer: Self-pay | Admitting: Family Medicine

## 2011-06-22 MED ORDER — AMLODIPINE BESY-BENAZEPRIL HCL 10-20 MG PO CAPS: 1.0000 | ORAL_CAPSULE | Freq: Every day | ORAL | Status: DC

## 2011-06-22 MED ORDER — ALPRAZOLAM 0.25 MG PO TABS: 0.2500 mg | ORAL_TABLET | Freq: Every evening | ORAL | Status: DC | PRN

## 2011-06-22 MED ORDER — DOCUSATE SODIUM 100 MG PO CAPS: ORAL_CAPSULE | ORAL | Status: DC

## 2011-06-22 NOTE — Telephone Encounter (Signed)
Sent in

## 2011-08-21 LAB — CBC
HCT: 31.4 — ABNORMAL LOW
Hemoglobin: 10.6 — ABNORMAL LOW
MCHC: 33.9
RDW: 16.7 — ABNORMAL HIGH

## 2011-08-21 LAB — DIFFERENTIAL
Basophils Absolute: 0
Basophils Relative: 1
Eosinophils Relative: 3
Lymphocytes Relative: 31
Monocytes Absolute: 0.4
Monocytes Relative: 9

## 2011-08-22 LAB — DIFFERENTIAL
Band Neutrophils: 0
Basophils Absolute: 0
Basophils Absolute: 0
Basophils Absolute: 0.1
Basophils Relative: 0
Basophils Relative: 0
Basophils Relative: 1
Eosinophils Absolute: 0.1
Eosinophils Relative: 1
Lymphocytes Relative: 15
Lymphs Abs: 0.7
Metamyelocytes Relative: 0
Monocytes Absolute: 0.3
Monocytes Relative: 6
Myelocytes: 0
Myelocytes: 0
Neutro Abs: 3.1
Neutro Abs: 3.3
Neutrophils Relative %: 55
Neutrophils Relative %: 55
Neutrophils Relative %: 60
Promyelocytes Absolute: 0
Promyelocytes Absolute: 0
Promyelocytes Absolute: 0
nRBC: 0

## 2011-08-22 LAB — CBC
HCT: 31.6 — ABNORMAL LOW
HCT: 32.4 — ABNORMAL LOW
MCHC: 32.6
MCHC: 33.5
MCHC: 33.5
MCV: 79.1
MCV: 79.5
MCV: 81
Platelets: 189
Platelets: 193
Platelets: 193
Platelets: 196
RBC: 3.78 — ABNORMAL LOW
RDW: 16.3 — ABNORMAL HIGH
RDW: 16.7 — ABNORMAL HIGH

## 2011-08-22 LAB — BASIC METABOLIC PANEL
BUN: 30 — ABNORMAL HIGH
Chloride: 107
Creatinine, Ser: 1.47 — ABNORMAL HIGH
Glucose, Bld: 99

## 2011-08-22 LAB — COMPREHENSIVE METABOLIC PANEL
AST: 26
Albumin: 3.3 — ABNORMAL LOW
Alkaline Phosphatase: 104
BUN: 22
GFR calc Af Amer: 45 — ABNORMAL LOW
Potassium: 3.9
Sodium: 138
Total Protein: 5.8 — ABNORMAL LOW

## 2011-08-22 LAB — IRON AND TIBC
Saturation Ratios: 7 — ABNORMAL LOW
TIBC: 335

## 2011-08-22 LAB — TYPE AND SCREEN: ABO/RH(D): AB NEG

## 2011-08-22 LAB — B-NATRIURETIC PEPTIDE (CONVERTED LAB): Pro B Natriuretic peptide (BNP): 168 — ABNORMAL HIGH

## 2011-08-22 LAB — FERRITIN: Ferritin: 16 (ref 10–291)

## 2011-08-22 LAB — RETICULOCYTES: Retic Ct Pct: 1.6

## 2011-08-30 LAB — DIFFERENTIAL
Eosinophils Absolute: 0.1
Eosinophils Relative: 2
Lymphs Abs: 1.7
Monocytes Absolute: 0.5
Monocytes Relative: 10

## 2011-08-30 LAB — BASIC METABOLIC PANEL
BUN: 33 — ABNORMAL HIGH
Chloride: 110
Potassium: 4.1
Sodium: 141

## 2011-08-30 LAB — CBC
HCT: 30.7 — ABNORMAL LOW
Hemoglobin: 10.5 — ABNORMAL LOW
MCV: 78.9
RBC: 3.89
WBC: 5.6

## 2011-11-21 ENCOUNTER — Telehealth: Payer: Self-pay

## 2011-11-22 NOTE — Telephone Encounter (Signed)
Will address at OV 

## 2011-11-26 ENCOUNTER — Other Ambulatory Visit: Payer: Self-pay

## 2011-11-26 ENCOUNTER — Encounter (HOSPITAL_COMMUNITY): Payer: Self-pay

## 2011-11-26 ENCOUNTER — Emergency Department (HOSPITAL_COMMUNITY): Payer: Medicare Other

## 2011-11-26 ENCOUNTER — Emergency Department (HOSPITAL_COMMUNITY)
Admission: EM | Admit: 2011-11-26 | Discharge: 2011-11-26 | Disposition: A | Discharge status: Home Health | Payer: Medicare Other | Attending: Emergency Medicine | Admitting: Emergency Medicine

## 2011-11-26 ENCOUNTER — Telehealth: Payer: Self-pay

## 2011-11-26 DIAGNOSIS — I10 Essential (primary) hypertension: Secondary | ICD-10-CM

## 2011-11-26 DIAGNOSIS — I4949 Other premature depolarization: Secondary | ICD-10-CM | POA: Insufficient documentation

## 2011-11-26 DIAGNOSIS — R42 Dizziness and giddiness: Secondary | ICD-10-CM

## 2011-11-26 DIAGNOSIS — M479 Spondylosis, unspecified: Secondary | ICD-10-CM | POA: Insufficient documentation

## 2011-11-26 DIAGNOSIS — R634 Abnormal weight loss: Secondary | ICD-10-CM

## 2011-11-26 DIAGNOSIS — F068 Other specified mental disorders due to known physiological condition: Secondary | ICD-10-CM

## 2011-11-26 DIAGNOSIS — B351 Tinea unguium: Secondary | ICD-10-CM

## 2011-11-26 DIAGNOSIS — K625 Hemorrhage of anus and rectum: Secondary | ICD-10-CM | POA: Insufficient documentation

## 2011-11-26 DIAGNOSIS — N750 Cyst of Bartholin's gland: Secondary | ICD-10-CM

## 2011-11-26 DIAGNOSIS — F0390 Unspecified dementia without behavioral disturbance: Secondary | ICD-10-CM | POA: Insufficient documentation

## 2011-11-26 DIAGNOSIS — F411 Generalized anxiety disorder: Secondary | ICD-10-CM | POA: Insufficient documentation

## 2011-11-26 DIAGNOSIS — K5730 Diverticulosis of large intestine without perforation or abscess without bleeding: Secondary | ICD-10-CM | POA: Insufficient documentation

## 2011-11-26 LAB — URINALYSIS, ROUTINE W REFLEX MICROSCOPIC
Protein, ur: NEGATIVE mg/dL
Specific Gravity, Urine: 1.005 (ref 1.005–1.030)
Urobilinogen, UA: 0.2 mg/dL (ref 0.0–1.0)

## 2011-11-26 LAB — COMPREHENSIVE METABOLIC PANEL
AST: 21 U/L (ref 0–37)
Albumin: 3.4 g/dL — ABNORMAL LOW (ref 3.5–5.2)
BUN: 15 mg/dL (ref 6–23)
CO2: 25 mEq/L (ref 19–32)
Calcium: 9.3 mg/dL (ref 8.4–10.5)
Creatinine, Ser: 1.27 mg/dL — ABNORMAL HIGH (ref 0.50–1.10)
GFR calc non Af Amer: 33 mL/min — ABNORMAL LOW (ref >90)
Total Bilirubin: 1 mg/dL (ref 0.3–1.2)

## 2011-11-26 LAB — CARDIAC PANEL(CRET KIN+CKTOT+MB+TROPI)
CK, MB: 3.2 ng/mL (ref 0.3–4.0)
Total CK: 85 U/L (ref 7–177)

## 2011-11-26 LAB — DIFFERENTIAL
Basophils Absolute: 0 10*3/uL (ref 0.0–0.1)
Basophils Relative: 0 % (ref 0–1)
Eosinophils Relative: 1 % (ref 0–5)
Monocytes Absolute: 0.2 10*3/uL (ref 0.1–1.0)
Monocytes Relative: 4 % (ref 3–12)

## 2011-11-26 LAB — CBC
HCT: 34 % — ABNORMAL LOW (ref 36.0–46.0)
Hemoglobin: 11.9 g/dL — ABNORMAL LOW (ref 12.0–15.0)
MCHC: 35 g/dL (ref 30.0–36.0)
MCV: 80.6 fL (ref 78.0–100.0)
RDW: 15.2 % (ref 11.5–15.5)

## 2011-11-26 LAB — URINE MICROSCOPIC-ADD ON

## 2011-11-26 NOTE — ED Provider Notes (Addendum)
History     CSN: [id number]  Arrival date & time 11/26/11  1037   First MD Initiated Contact with Patient 11/26/11 1056      Chief Complaint  Patient presents with  . GI Bleeding    (Consider location/radiation/quality/duration/timing/severity/associated sxs/prior treatment) HPI [name] is a 78 y.o. female with a h/o diverticular disease that required resection 60 years ago was brought by her daughter to the Emergency Department complaining of  Rectal bleeding that began last night. She states there was blood on the tissue and the stool was dark. This morning she had another stool she states had blood. Her daughter, with whom she is living, did not see blood either time.  Per the daughter, patient has been living with her in [place] for 3 weeks. She recently was brought back to [facility] from [place] where she was in a nursing home. She was, according to the patient and her daughter, taken from [place] by a niece with whom she lived for a short period of time. She was hospitalized and from there went to a nursing home. None of her family in [place] was aware of the placement. Once they were notified, they arranged for her to return to [facility].  PCP Dr. Lodema Hong  Past Medical History  Diagnosis Date  . Diverticular disease   . Anxiety   . Dementia   . DJD (degenerative joint disease)   . Hypertension     Past Surgical History  Procedure Date  . Abdominal surgery     No family history on file.  History  Substance Use Topics  . Smoking status: Never Smoker   . Smokeless tobacco: Not on file  . Alcohol Use: No    OB History    Grav Para Term Preterm Abortions TAB SAB Ect Mult Living                  Review of Systems 10 Systems reviewed and are negative for acute change except as noted in the HPI. Allergies  Review of patient's allergies indicates no known allergies.  Home Medications   Current Outpatient Rx  Name Route Sig Dispense Refill  . ALPRAZOLAM 0.25 MG PO  TABS Oral Take 1 tablet (0.25 mg total) by mouth at bedtime as needed. 30 tablet 2  . AMLODIPINE BESY-BENAZEPRIL HCL 10-20 MG PO CAPS Oral Take 1 capsule by mouth daily. 30 capsule 2  . DOCUSATE SODIUM 100 MG PO CAPS  Take 100 mg by mouth daily. 30 capsule 2  . QUAD CANE MISC Does not apply by Does not apply route. Use as directed       . CENTRUM PO LIQD Oral Take by mouth daily.        BP 141/42  Pulse 76  Temp(Src) 98.4 F (36.9 C) (Oral)  Resp 18  SpO2 100%  Physical Exam  Nursing note and vitals reviewed. Constitutional: She is oriented to person, place, and time.       Thin elderly woman in no acute distress.  HENT:  Head: Normocephalic.  Right Ear: External ear normal.  Left Ear: External ear normal.  Mouth/Throat: Oropharynx is clear and moist.  Eyes: Conjunctivae and EOM are normal. Pupils are equal, round, and reactive to light.  Neck: Normal range of motion.  Cardiovascular: Normal rate, normal heart sounds and intact distal pulses.   Pulmonary/Chest: Effort normal and breath sounds normal.  Abdominal: Soft. Bowel sounds are normal.  Genitourinary:       Rectal exam  with brown stool, guaiac negative. No obvious external or internal hemorrhoids.   Musculoskeletal: Normal range of motion.  Neurological: She is alert and oriented to person, place, and time.    ED Course  Procedures (including critical care time)   Date: 11/26/2011 1137  Rate: 60  Rhythm: normal sinus rhythm and premature ventricular contractions (PVC)  QRS Axis: normal  Intervals: normal  ST/T Wave abnormalities: normal  Conduction Disutrbances:none  Narrative Interpretation:   Old EKG Reviewed: unchanged c/w 09/04/07 1130 Social worker, Don here to see and talk with daughter. He advises that when patient is admitted they will begin to work on placement. 1350 Spoke with Dr. Lendell Caprice. She will see the patient in the ER. 1430 Santina Evans, social worker here to see patient. Spoke with daughter  Aldean Jewett on phone and has arranged for home health to begin tomorrow. Awaiting Dr. Lendell Caprice to see patient and decide if meets criteria for admission. 1450 Dr. Lendell Caprice advised that patient does not meet criteria. She is aware of home health plans set up for tomorrow.   MDM  Elderly lady with c/o rectal bleeding x 2 in last 24 hours. Stool here guaiac negative, however hgb is 11.9, down from last recorded here of 14 12/21/09. With no evidence of bleeding on PE will arrange for admission for to monitor for further bleeding.Daughter is concerned about care at home. She would like to consider nursing home placement.  Nicoletta Dress. Colon Branch, MD 11/26/11 1409  MDM Reviewed: nursing note and vitals Interpretation: labs, x-ray and ECG Total time providing critical care: 40. Consults: hospitalist, social services, care management.     Nicoletta Dress. Colon Branch, MD 11/27/11 [phone]

## 2011-11-26 NOTE — ED Notes (Signed)
Spoke with family member, advised she would be discharged and is ready for pick up.

## 2011-11-26 NOTE — Progress Notes (Signed)
CM spoke briefly with patient who was alert and able to communicate. Called daughter, Aldean Jewett @ [phone]. Her mom was previously in a NH [facility] in [place]. Per daughter, she is unable to care for her at her home. Daughter was hopeful that her mother could be placed from a short stay in hospital. Pt is stable and CM explained to daughter that even if she was admitted as observation Medicare would not pay for placement since she would most likely not qualify for a "qualified stay". CM offered HH, RN, Aide and SW for her at home. The daughter seems to accept this as an option. Daughter would like to use [facility] services if [facility] is ordered. Rosemary Holms RN BSN CM [phone]

## 2011-11-26 NOTE — ED Notes (Signed)
Spoke with Roe Coombs from social services to come to the ED to evaluate patient for possible placement

## 2011-11-26 NOTE — ED Notes (Signed)
Family at bedside. Patient does not need anything at this time. 

## 2011-11-26 NOTE — ED Notes (Signed)
Patient is comfortable and eating at this time. Does not need anything else at this time.

## 2011-11-26 NOTE — Telephone Encounter (Signed)
Pt has diverticulosis, which is a condition that can cause severe rectal bleeding, which can be life threatening Ms Muenchow has needed to be hospitalized in the past for this.  Based on her history and her age, it is safest for her to go to the ED for evaluation, she will need blood work urgently, try and explain this. If she agrees to take her let her know I will call ahead and let the ED doc know she is on the way, verify which hospital

## 2011-11-26 NOTE — Consult Note (Signed)
Date of Admission:  11/26/2011  Date of Consult:  11/26/2011  Reason for Consult: Reported hematochesia, family requesting nursing home placement Referring Physician: Ellin Saba  Impression/Recommendation 1: Reported hematochezia. Patient's hemoglobin, blood pressure are stable. She had heme-negative stool today. This may have been hemorrhoids or anal fissure. She does not meet criteria for inpatient admission. And those, would not have a qualifying stay for skilled nursing facility. Regardless of whether patient is observed in house, family would have to pay out of pocket. The care manager spoke with the family and they would like to take her home with home nursing, social worker, aid. Patient is agreeable to this plan.  2: History of diverticular disease with reported resection of bowel many years ago. No evidence of active GI bleeding.  3: Debility: It appears that patient is probably at her functional baseline and needs no further workup for weakness at this time.   HPI [name] is an 78 y.o. female who reported having some blood in her stool. This was not witnessed by family members. The patient has a history of diverticulosis and was told previously to come to the emergency room for any signs of GI bleeding. Apparently, she also called Dr. Lodema Hong who recommended evaluation in the emergency room. As a side note, family also was hoping for nursing home placement after hospitalization. The patient has had no nausea, vomiting, abdominal pain. She has eaten lunch here in the emergency room.  Past Medical History  Diagnosis Date  . Diverticular disease   . Anxiety   . Dementia   . DJD (degenerative joint disease)   . Hypertension     No current facility-administered medications on file prior to encounter.   Current Outpatient Prescriptions on File Prior to Encounter  Medication Sig Dispense Refill  . amLODipine-benazepril (LOTREL) 10-20 MG per capsule Take 1 capsule by  mouth daily.  30 capsule  2  . docusate sodium (COLACE) 100 MG capsule Take 100 mg by mouth daily.  30 capsule  2   Allergies: No Known Allergies  Social History:  reports that she has never smoked. She does not have any smokeless tobacco history on file. She reports that she does not drink alcohol or use illicit drugs.  Noncontributory family history  Past Surgical History  Procedure Date  . Abdominal surgery    Review of systems: As above otherwise negative  Blood pressure 146/57, pulse 72, temperature 98.4 F (36.9 C), temperature source Oral, resp. rate 25, SpO2 100.00%.  BP 146/57  Pulse 72  Temp(Src) 98.4 F (36.9 C) (Oral)  Resp 25  SpO2 100%  General Appearance:    Alert, cooperative, no distress, frail elderly woman who is oriented and able to give history   Head:    Normocephalic, without obvious abnormality, atraumatic  Eyes:    PERRL, conjunctiva/corneas clear, EOM's intact, fundi    benign, both eyes  Ears:    Normal TM's and external ear canals, both ears  Nose:   Nares normal, septum midline, mucosa normal, no drainage    or sinus tenderness  Throat:   Lips, mucosa, and tongue normal; teeth and gums normal  Neck:   Supple, symmetrical, trachea midline, no adenopathy;    thyroid:  no enlargement/tenderness/nodules; no carotid   bruit or JVD  Back:     Symmetric, no curvature, ROM normal, no CVA tenderness  Lungs:     Clear to auscultation bilaterally, respirations unlabored  Chest Wall:    No tenderness or  deformity   Heart:    Regular rate and rhythm, S1 and S2 normal, no murmur, rub   or gallop  Breast Exam:    deferred   Abdomen:     Soft, non-tender, bowel sounds active all four quadrants,    no masses, no organomegaly  Genitalia:    deferred   Rectal:    per ED physician shows heme negative stool and no obvious hemorrhoids   Extremities:   Extremities normal, atraumatic, no cyanosis or edema  Pulses:   2+ and symmetric all extremities  Skin:   Skin  color, texture, turgor normal, no rashes or lesions  Lymph nodes:   Cervical, supraclavicular, and axillary nodes normal  Neurologic:   CNII-XII intact, normal strength, sensation and reflexes    throughout    Psychiatric: Normal affect. Cooperative.  Results for orders placed during the hospital encounter of 11/26/11 (from the past 48 hour(s))  OCCULT BLOOD, POC DEVICE     Status: Normal   Collection Time   11/26/11 11:19 AM      Component Value Range Comment   Fecal Occult Bld NEGATIVE     CBC     Status: Abnormal   Collection Time   11/26/11 11:28 AM      Component Value Range Comment   WBC 4.9  4.0 - 10.5 (K/uL)    RBC 4.22  3.87 - 5.11 (MIL/uL)    Hemoglobin 11.9 (*) 12.0 - 15.0 (g/dL)    HCT 16.1 (*) 09.6 - 46.0 (%)    MCV 80.6  78.0 - 100.0 (fL)    MCH 28.2  26.0 - 34.0 (pg)    MCHC 35.0  30.0 - 36.0 (g/dL)    RDW 04.5  40.9 - 81.1 (%)    Platelets 203  150 - 400 (K/uL)   DIFFERENTIAL     Status: Normal   Collection Time   11/26/11 11:28 AM      Component Value Range Comment   Neutrophils Relative 71  43 - 77 (%)    Neutro Abs 3.4  1.7 - 7.7 (K/uL)    Lymphocytes Relative 24  12 - 46 (%)    Lymphs Abs 1.2  0.7 - 4.0 (K/uL)    Monocytes Relative 4  3 - 12 (%)    Monocytes Absolute 0.2  0.1 - 1.0 (K/uL)    Eosinophils Relative 1  0 - 5 (%)    Eosinophils Absolute 0.0  0.0 - 0.7 (K/uL)    Basophils Relative 0  0 - 1 (%)    Basophils Absolute 0.0  0.0 - 0.1 (K/uL)   COMPREHENSIVE METABOLIC PANEL     Status: Abnormal   Collection Time   11/26/11 11:28 AM      Component Value Range Comment   Sodium 139  135 - 145 (mEq/L)    Potassium 4.0  3.5 - 5.1 (mEq/L)    Chloride 106  96 - 112 (mEq/L)    CO2 25  19 - 32 (mEq/L)    Glucose, Bld 144 (*) 70 - 99 (mg/dL)    BUN 15  6 - 23 (mg/dL)    Creatinine, Ser 9.14 (*) 0.50 - 1.10 (mg/dL)    Calcium 9.3  8.4 - 10.5 (mg/dL)    Total Protein 6.3  6.0 - 8.3 (g/dL)    Albumin 3.4 (*) 3.5 - 5.2 (g/dL)    AST 21  0 - 37 (U/L)    ALT  9  0 - 35 (U/L)  Alkaline Phosphatase 135 (*) 39 - 117 (U/L)    Total Bilirubin 1.0  0.3 - 1.2 (mg/dL)    GFR calc non Af Amer 33 (*) >90 (mL/min)    GFR calc Af Amer 38 (*) >90 (mL/min)   CARDIAC PANEL(CRET KIN+CKTOT+MB+TROPI)     Status: Normal   Collection Time   11/26/11 11:28 AM      Component Value Range Comment   Total CK 85  7 - 177 (U/L)    CK, MB 3.2  0.3 - 4.0 (ng/mL)    Troponin I <0.30  <0.30 (ng/mL)    Relative Index RELATIVE INDEX IS INVALID  0.0 - 2.5    URINALYSIS, ROUTINE W REFLEX MICROSCOPIC     Status: Abnormal   Collection Time   11/26/11 11:57 AM      Component Value Range Comment   Color, Urine YELLOW  YELLOW     APPearance CLEAR  CLEAR     Specific Gravity, Urine 1.005  1.005 - 1.030     pH 6.0  5.0 - 8.0     Glucose, UA NEGATIVE  NEGATIVE (mg/dL)    Hgb urine dipstick SMALL (*) NEGATIVE     Bilirubin Urine NEGATIVE  NEGATIVE     Ketones, ur NEGATIVE  NEGATIVE (mg/dL)    Protein, ur NEGATIVE  NEGATIVE (mg/dL)    Urobilinogen, UA 0.2  0.0 - 1.0 (mg/dL)    Nitrite NEGATIVE  NEGATIVE     Leukocytes, UA SMALL (*) NEGATIVE    URINE MICROSCOPIC-ADD ON     Status: Abnormal   Collection Time   11/26/11 11:57 AM      Component Value Range Comment   WBC, UA 3-6  <3 (WBC/hpf)    RBC / HPF 3-6  <3 (RBC/hpf)    Bacteria, UA FEW (*) RARE      Dg Chest Port 1 View  11/26/2011  *RADIOLOGY REPORT*  Clinical Data: Weakness.  PORTABLE CHEST - 1 VIEW  Comparison: 04/25/2007  Findings: Heart is mildly enlarged.  No confluent airspace opacities or effusions.  No bony abnormality.  IMPRESSION: Stable cardiomegaly.  No active disease.  Original Report Authenticated By: Cyndie Chime, M.D.     Thanh Pomerleau L 11/26/2011, 5:30 PM

## 2011-11-26 NOTE — ED Notes (Signed)
Pt reports noticed dark stools yesterday.  PT c/o generalized weakness.  Reports was nauseated this morning, denies vomiting.

## 2011-11-26 NOTE — Telephone Encounter (Signed)
Called number listed with no answer.

## 2011-11-30 ENCOUNTER — Encounter: Payer: Self-pay | Admitting: Family Medicine

## 2011-11-30 ENCOUNTER — Telehealth: Payer: Self-pay | Admitting: Family Medicine

## 2011-11-30 NOTE — Telephone Encounter (Signed)
Appointment scheduled for 12/2

## 2011-11-30 NOTE — Telephone Encounter (Signed)
noted 

## 2011-11-30 NOTE — Telephone Encounter (Signed)
Noted, please ask that pt has appt in the office next week

## 2011-12-04 ENCOUNTER — Ambulatory Visit (INDEPENDENT_AMBULATORY_CARE_PROVIDER_SITE_OTHER): Payer: Medicare Other | Admitting: Family Medicine

## 2011-12-04 VITALS — BP 154/82 | HR 64 | Resp 20 | Wt 104.0 lb

## 2011-12-04 DIAGNOSIS — N390 Urinary tract infection, site not specified: Secondary | ICD-10-CM | POA: Insufficient documentation

## 2011-12-04 DIAGNOSIS — K5730 Diverticulosis of large intestine without perforation or abscess without bleeding: Secondary | ICD-10-CM

## 2011-12-04 DIAGNOSIS — F411 Generalized anxiety disorder: Secondary | ICD-10-CM

## 2011-12-04 DIAGNOSIS — I10 Essential (primary) hypertension: Secondary | ICD-10-CM

## 2011-12-04 MED ORDER — ALPRAZOLAM 0.25 MG PO TABS: 0.2500 mg | ORAL_TABLET | Freq: Every evening | ORAL | Status: DC | PRN

## 2011-12-04 MED ORDER — AMLODIPINE BESY-BENAZEPRIL HCL 10-20 MG PO CAPS: 1.0000 | ORAL_CAPSULE | Freq: Every day | ORAL | Status: DC

## 2011-12-04 MED ORDER — DOCUSATE SODIUM 100 MG PO CAPS: ORAL_CAPSULE | ORAL | Status: DC

## 2011-12-04 NOTE — Patient Instructions (Signed)
F/U in 5 to 6  weeks.  Your urine is being checked for infection, we will contact you with the result.  No changes in medication at this time.  Welcome back to [place].  I recommend family home placement for you and your daughter is checking on this, l/L family home, or [facility] or [facility] long term care facility

## 2011-12-04 NOTE — Assessment & Plan Note (Signed)
Abnormal ccua in ED on 01/14, will send c/s only from the office today

## 2011-12-04 NOTE — Assessment & Plan Note (Signed)
No bleed since December , [place], will try to get records

## 2011-12-04 NOTE — Assessment & Plan Note (Signed)
No med change though systolic elevated

## 2011-12-07 LAB — URINE CULTURE: Colony Count: >=100000

## 2011-12-10 ENCOUNTER — Encounter: Payer: Self-pay | Admitting: Family Medicine

## 2011-12-10 MED ORDER — ALPRAZOLAM 0.25 MG PO TABS: 0.2500 mg | ORAL_TABLET | Freq: Every day | ORAL | Status: DC

## 2011-12-10 NOTE — Assessment & Plan Note (Signed)
Unchanged, will maintain her on xanax at bedtime

## 2011-12-10 NOTE — Progress Notes (Signed)
  Subjective:    Patient ID: [name], female    DOB: Mar 25, 1906, 78 y.o.   MRN: [id number]  HPI Pt in for f/u. She returned in December to [facility] and is here with her adopted daughter, who is looking to place her in a family home as she is unable to care for her in her own place. [name] herself states she knows she cannot live on her own anymore, and is willing to be placed. She was hospitalized up [place] then [place] in a rehab facility, the cause of the hospitalization is unknown, and the family member who has her here today states that communication was cut off when [name] was up [place] so records will not be forthcoming. Pt had reportedly had rectal bleeding recently, however checked out negative in the ED, She is wheelchair dependent due severe arthritis   Review of Systems    See HPI Denies recent fever or chills. Denies sinus pressure, nasal congestion, ear pain or sore throat. Denies chest congestion, productive cough or wheezing. Denies chest pains, palpitations and leg swelling Denies abdominal pain, nausea, vomiting,diarrhea or constipation.   Denies dysuria, frequency, hesitancy or incontinence. Denies headaches, seizures, numbness, or tingling. Chronic anxiety and mild insomnia. Denies skin break down or rash.     Objective:   Physical Exam Patient alert  and in no cardiopulmonary distress.  HEENT: No facial asymmetry, EOMI, no sinus tenderness,  oropharynx pink and moist.  Neck  no adenopathy.Decreased ROM  Chest: Clear to auscultation bilaterally.  CVS: S1, S2 no murmurs, no S3.  ABD: Soft non tender. Bowel sounds normal.  Ext: No edema  GL:OVFIEPPIR :ROM spine, shoulders, hips and knees.  Skin: Intact, no ulcerations or rash noted.  Psych: Good eye contact, normal affect. Memory impaired  not anxious or depressed appearing.  CNS: CN 2-12 intact.        Assessment & Plan:

## 2011-12-11 ENCOUNTER — Emergency Department (HOSPITAL_COMMUNITY)
Admission: EM | Admit: 2011-12-11 | Discharge: 2011-12-11 | Disposition: A | Discharge status: Home / Self Care | Payer: Medicare Other | Attending: Emergency Medicine | Admitting: Emergency Medicine

## 2011-12-11 ENCOUNTER — Encounter (HOSPITAL_COMMUNITY): Payer: Self-pay | Admitting: *Deleted

## 2011-12-11 DIAGNOSIS — F068 Other specified mental disorders due to known physiological condition: Secondary | ICD-10-CM | POA: Insufficient documentation

## 2011-12-11 DIAGNOSIS — I10 Essential (primary) hypertension: Secondary | ICD-10-CM | POA: Insufficient documentation

## 2011-12-11 DIAGNOSIS — K5900 Constipation, unspecified: Secondary | ICD-10-CM | POA: Insufficient documentation

## 2011-12-11 DIAGNOSIS — Z139 Encounter for screening, unspecified: Secondary | ICD-10-CM

## 2011-12-11 NOTE — ED Notes (Signed)
Pt's daughter has arrived to transport pt home.  Pt taken to POV via wheelchair.

## 2011-12-11 NOTE — ED Provider Notes (Signed)
History     CSN: [id number]  Arrival date & time 12/11/11  1152   First MD Initiated Contact with Patient 12/11/11 1503      Chief Complaint  Patient presents with  . Constipation    (Consider location/radiation/quality/duration/timing/severity/associated sxs/prior treatment) HPI Comments: Patient's family is no longer with the patient at this time. The patient reports that she felt constipated and was not able to have a bowel movement earlier today. She denies any rectal bleeding at present. Prior to my evaluation, the patient was taken to the restroom because she felt like she could go. She reports that she had a "good" bowel movements. She denies any nausea or vomiting, no fever or chills and no diarrhea. She reports that her symptoms have now resolved as far as the symptoms for which she came here to the emergency department.  Patient is a 78 y.o. female presenting with constipation. The history is provided by the patient.  Constipation  Pertinent negatives include no fever, no diarrhea, no nausea and no vomiting.    Past Medical History  Diagnosis Date  . Diverticular disease   . Anxiety   . Dementia   . DJD (degenerative joint disease)   . Hypertension     Past Surgical History  Procedure Date  . Abdominal surgery     No family history on file.  History  Substance Use Topics  . Smoking status: Never Smoker   . Smokeless tobacco: Not on file  . Alcohol Use: No    OB History    Grav Para Term Preterm Abortions TAB SAB Ect Mult Living                  Review of Systems  Constitutional: Negative for fever.  Gastrointestinal: Positive for constipation. Negative for nausea, vomiting, diarrhea and blood in stool.    Allergies  Review of patient's allergies indicates no known allergies.  Home Medications   Current Outpatient Rx  Name Route Sig Dispense Refill  . AMLODIPINE BESY-BENAZEPRIL HCL 10-20 MG PO CAPS Oral Take 1 capsule by mouth daily. 30 capsule  2    BP 139/76  Pulse 71  Temp(Src) 98.3 F (36.8 C) (Oral)  Resp 20  SpO2 100%  Physical Exam  Vitals reviewed. Constitutional: She appears well-developed and well-nourished.  HENT:  Head: Normocephalic.  Mouth/Throat: Mucous membranes are not dry.  Abdominal: Soft. She exhibits no distension. There is no tenderness. There is no rebound and no guarding.  Neurological: She is alert.  Skin: Skin is warm and dry. No pallor.  Psychiatric: She has a normal mood and affect.    ED Course  Procedures (including critical care time)  Labs Reviewed - No data to display No results found.   1. Screening       MDM   Patient has had a good bowel movement according to her while here in emergency department. Patient is stable to follow up with her primary care physician, Dr. Lodema Hong in [place] for any further acute issues. Her abdominal exam currently shows no guarding and no rebound and no significant tenderness. We will recheck her vital signs, give her some oral fluids to drink and she does fine she'll be discharged home.        [name]. Margaret Rickel, MD 12/11/11 1617
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
## 2011-12-11 NOTE — ED Notes (Signed)
Patient states she could not go to the bathroom today.  She denies pain.  Patient ate chicken pot pie last night and feels like that caused her sx.  Patient states she cannot take laxative due to stomach surgery

## 2011-12-13 ENCOUNTER — Telehealth: Payer: Self-pay

## 2011-12-13 NOTE — Telephone Encounter (Signed)
States they are trying to get her placed but the family is having a hard time with her and she already has orders for xanax at bedtime. Could they have new order to give her an additional 0.5-1tab during the day as well [phone]

## 2011-12-14 ENCOUNTER — Other Ambulatory Visit: Payer: Self-pay | Admitting: Family Medicine

## 2011-12-14 DIAGNOSIS — M1710 Unilateral primary osteoarthritis, unspecified knee: Secondary | ICD-10-CM

## 2011-12-14 DIAGNOSIS — F411 Generalized anxiety disorder: Secondary | ICD-10-CM

## 2011-12-14 DIAGNOSIS — F0391 Unspecified dementia with behavioral disturbance: Secondary | ICD-10-CM

## 2011-12-14 DIAGNOSIS — I10 Essential (primary) hypertension: Secondary | ICD-10-CM

## 2011-12-14 MED ORDER — ALPRAZOLAM 0.25 MG PO TABS: 0.2500 mg | ORAL_TABLET | Freq: Two times a day (BID) | ORAL | Status: DC | PRN

## 2011-12-14 NOTE — Telephone Encounter (Signed)
pls let them know I suggest risperidal at bedtime instead a,nd continue one xanax once daily, I will send in the ripeidal

## 2011-12-14 NOTE — Telephone Encounter (Signed)
Change in decision. Pt to take the xanax twice daily as needed, entered historically pls fax after speaking with them, no risperidone please

## 2011-12-14 NOTE — Telephone Encounter (Signed)
Left message with RN and called it to apothecary

## 2012-01-09 ENCOUNTER — Other Ambulatory Visit: Payer: Self-pay | Admitting: Family Medicine

## 2012-02-05 ENCOUNTER — Other Ambulatory Visit: Payer: Self-pay | Admitting: Family Medicine

## 2012-02-06 ENCOUNTER — Telehealth: Payer: Self-pay | Admitting: Family Medicine

## 2012-02-06 NOTE — Telephone Encounter (Signed)
Codes given

## 2012-02-27 ENCOUNTER — Encounter: Payer: Self-pay | Admitting: Family Medicine

## 2012-02-27 ENCOUNTER — Ambulatory Visit (INDEPENDENT_AMBULATORY_CARE_PROVIDER_SITE_OTHER): Payer: Medicare Other | Admitting: Family Medicine

## 2012-02-27 VITALS — BP 152/66 | HR 50 | Resp 18 | Ht 62.0 in | Wt 103.0 lb

## 2012-02-27 DIAGNOSIS — R32 Unspecified urinary incontinence: Secondary | ICD-10-CM | POA: Insufficient documentation

## 2012-02-27 DIAGNOSIS — F068 Other specified mental disorders due to known physiological condition: Secondary | ICD-10-CM

## 2012-02-27 DIAGNOSIS — M479 Spondylosis, unspecified: Secondary | ICD-10-CM

## 2012-02-27 DIAGNOSIS — K5730 Diverticulosis of large intestine without perforation or abscess without bleeding: Secondary | ICD-10-CM

## 2012-02-27 DIAGNOSIS — F411 Generalized anxiety disorder: Secondary | ICD-10-CM

## 2012-02-27 DIAGNOSIS — I10 Essential (primary) hypertension: Secondary | ICD-10-CM

## 2012-02-27 DIAGNOSIS — Z111 Encounter for screening for respiratory tuberculosis: Secondary | ICD-10-CM

## 2012-02-27 NOTE — Patient Instructions (Signed)
F/u in 2 days for tB test to be read by nurse.  F/U in 4.5 month for MD visit.  You are doing very well.  Continue current medication  I will request diapers for you.  Please continue to use the cane you are doing very well with it

## 2012-02-27 NOTE — Progress Notes (Signed)
  Subjective:    Patient ID: [name], female    DOB: 1906-05-02, 78 y.o.   MRN: [id number]  HPI The PT is here for follow up and re-evaluation of chronic medical conditions, medication management and review of any available recent lab and radiology data.  Preventive health is updated, specifically   Immunization.    The PT denies any adverse reactions to current medications since the last visit.  There are no new concerns.  There are no specific complaints  Currently living in a family home for the past approximately 2 months, and she has settled in. No concerns voiced, pt needs TB skin test. No falls, ambulates with a cane, requesting a wheelchair which she has had in the past , but doing well without     Review of Systems See HPI Denies recent fever or chills. Denies sinus pressure, nasal congestion, ear pain or sore throat. Denies chest congestion, productive cough or wheezing. Denies chest pains, palpitations and leg swelling Denies abdominal pain, nausea, vomiting,diarrhea or constipation.   Denies dysuria, frequency, hesitancy , has longstanding  Incontinence, and needs supplies . Denies headaches, seizures, numbness, or tingling. Denies depression,has mild  anxiety  And insomnia.has memory loss with confusion, but verall doing well for her age Denies skin break down or rash.       Objective:   Physical Exam Patient alert  and in no cardiopulmonary distress.  HEENT: No facial asymmetry, EOMI, no sinus tenderness,  oropharynx  moist.  Neck adequate ROM no adenopathy.  Chest: Clear to auscultation bilaterally.  CVS: S1, S2 no murmurs, no S3.  ABD: Soft non tender. Bowel sounds normal.  Ext: No edema  MS: Adequate though reduced  ROM spine, shoulders, hips and knees.  Skin: Intact, no ulcerations or rash noted.  Psych: Good eye contact, normal affect. Memory loss not anxious or depressed appearing.  CNS: CN 2-12 intact, power,  normal  throughout.        Assessment & Plan:

## 2012-02-28 MED ORDER — DOCUSATE SODIUM 100 MG PO CAPS: 100.0000 mg | ORAL_CAPSULE | Freq: Every day | ORAL | Status: DC

## 2012-02-28 MED ORDER — AMLODIPINE BESY-BENAZEPRIL HCL 10-20 MG PO CAPS: 1.0000 | ORAL_CAPSULE | Freq: Every day | ORAL | Status: DC

## 2012-03-02 DIAGNOSIS — R32 Unspecified urinary incontinence: Secondary | ICD-10-CM | POA: Insufficient documentation

## 2012-03-02 NOTE — Assessment & Plan Note (Signed)
Controlled,though not at goal, no change in medication

## 2012-03-02 NOTE — Assessment & Plan Note (Signed)
Relies on adult diapers for protection, scrip provided

## 2012-03-02 NOTE — Assessment & Plan Note (Signed)
Progressively worsening

## 2012-03-02 NOTE — Assessment & Plan Note (Signed)
Controlled, no change in medication  

## 2012-03-02 NOTE — Assessment & Plan Note (Signed)
No recent rectal bleeding, stable

## 2012-03-02 NOTE — Assessment & Plan Note (Signed)
Chronic,worsening, able to ambulate safely with a cane at this time

## 2012-03-03 LAB — TB SKIN TEST: TB Skin Test: NEGATIVE mm

## 2012-03-13 ENCOUNTER — Telehealth: Payer: Self-pay | Admitting: Family Medicine

## 2012-03-14 NOTE — Telephone Encounter (Signed)
We received the paper and Dr Lodema Hong has the paper to fill out for her supplies

## 2012-03-24 ENCOUNTER — Telehealth: Payer: Self-pay | Admitting: Family Medicine

## 2012-03-24 NOTE — Telephone Encounter (Signed)
Please call back

## 2012-03-24 NOTE — Telephone Encounter (Signed)
Concern addressed by dr.

## 2012-03-24 NOTE — Telephone Encounter (Signed)
pls fax over the referral to [facility], know [name] pt is referred because I have been informed that medicaid requires home health visits every 60 days to justify supplies for incontinence

## 2012-03-26 NOTE — Telephone Encounter (Signed)
Noted.  [name] is aware.

## 2012-05-05 ENCOUNTER — Other Ambulatory Visit: Payer: Self-pay | Admitting: Family Medicine

## 2012-05-13 ENCOUNTER — Encounter: Payer: Self-pay | Admitting: Family Medicine

## 2012-05-13 ENCOUNTER — Ambulatory Visit (INDEPENDENT_AMBULATORY_CARE_PROVIDER_SITE_OTHER): Payer: Medicare Other | Admitting: Family Medicine

## 2012-05-13 VITALS — BP 160/70 | HR 80 | Resp 15 | Ht 62.0 in | Wt 99.0 lb

## 2012-05-13 DIAGNOSIS — Z111 Encounter for screening for respiratory tuberculosis: Secondary | ICD-10-CM

## 2012-05-13 DIAGNOSIS — I10 Essential (primary) hypertension: Secondary | ICD-10-CM

## 2012-05-13 DIAGNOSIS — R32 Unspecified urinary incontinence: Secondary | ICD-10-CM

## 2012-05-13 DIAGNOSIS — F411 Generalized anxiety disorder: Secondary | ICD-10-CM

## 2012-05-13 DIAGNOSIS — F068 Other specified mental disorders due to known physiological condition: Secondary | ICD-10-CM

## 2012-05-13 MED ORDER — CENTRUM PO LIQD: 5.0000 mL | Freq: Every day | ORAL | Status: DC

## 2012-05-13 NOTE — Addendum Note (Signed)
Addended by: Abner Greenspan on: 05/13/2012 11:53 AM   Modules accepted: Orders

## 2012-05-13 NOTE — Assessment & Plan Note (Signed)
Stable and on no medication 

## 2012-05-13 NOTE — Assessment & Plan Note (Signed)
Sub optimal control, no med change 

## 2012-05-13 NOTE — Progress Notes (Signed)
  Subjective:    Patient ID: [name], female    DOB: [birth year], 78 y.o.   MRN: [id number]  HPI Pt is here for f/u of her chronic problems. Her caregiver has no concerns, reports good appetite, no fever or chills , no head or chest congestion, no urinary symptoms. She has been ambulating safely with a cane , though pt keeps asking for her wheelchair. Needs PPD placement step 2   Review of Systems See HPI Denies recent fever or chills. Denies sinus pressure, nasal congestion, ear pain or sore throat. Denies chest congestion, productive cough or wheezing. Denies chest pains, palpitations and leg swelling Denies abdominal pain, nausea, vomiting,diarrhea or constipation.   Denies dysuria, frequency, hesitancy or incontinence.  Denies headaches, seizures, numbness, or tingling. Denies depression, anxiety or insomnia. Denies skin break down or rash.        Objective:   Physical Exam Patient alert and oriented and in no cardiopulmonary distress.  HEENT: No facial asymmetry, EOMI, no sinus tenderness,  oropharynx pink and moist.  Neck supple no adenopathy.  Chest: Clear to auscultation bilaterally.  CVS: S1, S2 no murmurs, no S3.  ABD: Soft non tender. Bowel sounds normal.  Ext: No edema  MS: Adequate though reduced  ROM spine, shoulders, hips and knees.  Skin: Intact, no ulcerations or rash noted.  Psych: Good eye contact, normal affect. Memory impaired  not anxious or depressed appearing.  CNS: CN 2-12 intact, power, tone and sensation normal throughout.        Assessment & Plan:

## 2012-05-13 NOTE — Assessment & Plan Note (Signed)
Incontinence pads used

## 2012-05-13 NOTE — Assessment & Plan Note (Signed)
Low diose of xanax at bedtime only to help with sleep

## 2012-05-13 NOTE — Patient Instructions (Addendum)
F/U in 4.5 month. Call if you need me before  tB test to be placed today and read on friday  Liquid centrum one teaspoon daily for health and appetite otherwise no changes in medication  Use tylenol one daily as needed for pain

## 2012-05-14 ENCOUNTER — Telehealth: Payer: Self-pay | Admitting: Family Medicine

## 2012-05-16 LAB — TB SKIN TEST: TB Skin Test: NEGATIVE

## 2012-05-16 NOTE — Telephone Encounter (Signed)
Med clarified.  

## 2012-07-10 ENCOUNTER — Encounter: Payer: Self-pay | Admitting: Family Medicine

## 2012-07-10 ENCOUNTER — Other Ambulatory Visit: Payer: Self-pay | Admitting: Family Medicine

## 2012-07-10 ENCOUNTER — Ambulatory Visit (INDEPENDENT_AMBULATORY_CARE_PROVIDER_SITE_OTHER): Payer: Medicare Other | Admitting: Family Medicine

## 2012-07-10 VITALS — BP 130/74 | HR 103 | Resp 16 | Ht 62.0 in | Wt 100.0 lb

## 2012-07-10 DIAGNOSIS — L0291 Cutaneous abscess, unspecified: Secondary | ICD-10-CM

## 2012-07-10 DIAGNOSIS — R5383 Other fatigue: Secondary | ICD-10-CM

## 2012-07-10 DIAGNOSIS — L0390 Cellulitis, unspecified: Secondary | ICD-10-CM | POA: Insufficient documentation

## 2012-07-10 DIAGNOSIS — F411 Generalized anxiety disorder: Secondary | ICD-10-CM

## 2012-07-10 DIAGNOSIS — E538 Deficiency of other specified B group vitamins: Secondary | ICD-10-CM

## 2012-07-10 DIAGNOSIS — R5381 Other malaise: Secondary | ICD-10-CM

## 2012-07-10 DIAGNOSIS — I10 Essential (primary) hypertension: Secondary | ICD-10-CM

## 2012-07-10 DIAGNOSIS — D649 Anemia, unspecified: Secondary | ICD-10-CM

## 2012-07-10 LAB — CBC WITH DIFFERENTIAL/PLATELET
Basophils Absolute: 0 10*3/uL (ref 0.0–0.1)
Basophils Relative: 1 % (ref 0–1)
Eosinophils Relative: 1 % (ref 0–5)
HCT: 42.2 % (ref 36.0–46.0)
Lymphocytes Relative: 27 % (ref 12–46)
MCHC: 34.1 g/dL (ref 30.0–36.0)
Monocytes Absolute: 0.4 10*3/uL (ref 0.1–1.0)
Neutro Abs: 3.4 10*3/uL (ref 1.7–7.7)
Platelets: 245 10*3/uL (ref 150–400)
RDW: 14.6 % (ref 11.5–15.5)
WBC: 5.4 10*3/uL (ref 4.0–10.5)

## 2012-07-10 LAB — BASIC METABOLIC PANEL
BUN: 19 mg/dL (ref 6–23)
Creat: 1.19 mg/dL — ABNORMAL HIGH (ref 0.50–1.10)
Potassium: 4.5 mEq/L (ref 3.5–5.3)

## 2012-07-10 LAB — TSH: TSH: 0.46 u[IU]/mL (ref 0.350–4.500)

## 2012-07-10 LAB — VITAMIN B12: Vitamin B-12: 527 pg/mL (ref 211–911)

## 2012-07-10 MED ORDER — CEPHALEXIN 250 MG PO CAPS: 250.0000 mg | ORAL_CAPSULE | Freq: Three times a day (TID) | ORAL | Status: AC

## 2012-07-10 NOTE — Patient Instructions (Addendum)
F/u in 3 month, please call if you need me before.  Blood pressure, heart and lungs are within normal  You will have access to a wheelchair when you need it, eg at the store or at [facility] today, cbc, chem 7 and tsh and vit B12 level  Start ensure supplement daily, pudding or drink and increase the quantitiy of food you eat so you get stronger.  No changes in medication at this time, except 5 days of antibiotics for sore on right leg

## 2012-07-11 ENCOUNTER — Other Ambulatory Visit: Payer: Self-pay | Admitting: Family Medicine

## 2012-07-11 LAB — HEMOGLOBIN A1C: Hgb A1c MFr Bld: 5.3 % (ref <5.7)

## 2012-07-14 DIAGNOSIS — R5383 Other fatigue: Secondary | ICD-10-CM | POA: Insufficient documentation

## 2012-07-14 NOTE — Progress Notes (Signed)
  Subjective:    Patient ID: [name], female    DOB: [birth year], 78 y.o.   MRN: [id number]  HPI Pt specifically requested of her caregiver, that she come in for an appointment , because she "just feels weak"Her is reportedly poor, there is no  Noted change in her bowel movements, there is no h/o malodorous urine, fever or chills. Pt keeps asking for her wheelchair, she ambulates safely with assistance in her home environment, and seldom goes out, there is no recent h/o falls   Review of Systems See HPI Denies recent fever or chills. Denies sinus pressure, nasal congestion, ear pain or sore throat. Denies chest congestion, productive cough or wheezing. Denies chest pains, palpitations and leg swelling Denies abdominal pain, nausea, vomiting,diarrhea or constipation.   Denies dysuria, frequency, hesitancy or incontinence. Mild  joint pain,  and limitation in mobility. Denies headaches, seizures, numbness, or tingling. Denies depression,uncontrolled  anxiety or insomnia.Takes xanax regularly C/o tenderness and redness on left leg        Objective:   Physical Exam  Patient alert  and in no cardiopulmonary distress.  HEENT: No facial asymmetry, EOMI, no sinus tenderness,  oropharynx pink and moist.  Neck decreased ROM no adenopathy.  Chest: Clear to auscultation bilaterally.  CVS: S1, S2 no murmurs, no S3.  ABD: Soft non tender. Bowel sounds normal.  Ext: No edema  MS: Decreased  ROM spine, shoulders, hips and knees.  Skin: Erythema , and tender nodule anterior right leg  Psych: Good eye contact, normal affect. Memory loss anxious   CNS: CN 2-12 intact, power, tone and sensation normal throughout.       Assessment & Plan:

## 2012-07-14 NOTE — Assessment & Plan Note (Signed)
Controlled, no change in medication  

## 2012-07-14 NOTE — Assessment & Plan Note (Signed)
Increased symptoms, rule out treatable cause with labs to asses Hb, B12 and thyroid , pt encouraged to increase food intak to improve symptom. Caregiver to try ensure

## 2012-07-14 NOTE — Assessment & Plan Note (Signed)
Increasing with advanced age, however , fair control on med

## 2012-07-14 NOTE — Assessment & Plan Note (Signed)
Mild cellulitis anterior leg, antibiotics prescribed

## 2012-10-14 ENCOUNTER — Other Ambulatory Visit: Payer: Self-pay | Admitting: Family Medicine

## 2012-10-17 ENCOUNTER — Ambulatory Visit: Payer: Medicare Other | Admitting: Family Medicine

## 2012-10-23 ENCOUNTER — Ambulatory Visit: Payer: Medicare Other | Admitting: Family Medicine

## 2012-11-07 ENCOUNTER — Encounter: Payer: Self-pay | Admitting: Family Medicine

## 2012-11-07 ENCOUNTER — Ambulatory Visit (INDEPENDENT_AMBULATORY_CARE_PROVIDER_SITE_OTHER): Payer: Medicare Other | Admitting: Family Medicine

## 2012-11-07 VITALS — BP 112/74 | HR 80 | Resp 16 | Ht 62.0 in | Wt 99.0 lb

## 2012-11-07 DIAGNOSIS — M479 Spondylosis, unspecified: Secondary | ICD-10-CM

## 2012-11-07 DIAGNOSIS — F411 Generalized anxiety disorder: Secondary | ICD-10-CM

## 2012-11-07 DIAGNOSIS — R32 Unspecified urinary incontinence: Secondary | ICD-10-CM

## 2012-11-07 DIAGNOSIS — I10 Essential (primary) hypertension: Secondary | ICD-10-CM

## 2012-11-07 DIAGNOSIS — B351 Tinea unguium: Secondary | ICD-10-CM

## 2012-11-07 LAB — BASIC METABOLIC PANEL
BUN: 16 mg/dL (ref 6–23)
CO2: 23 mEq/L (ref 19–32)
Chloride: 101 mEq/L (ref 96–112)
Glucose, Bld: 176 mg/dL — ABNORMAL HIGH (ref 70–99)
Potassium: 4.4 mEq/L (ref 3.5–5.3)

## 2012-11-07 LAB — HEPATIC FUNCTION PANEL
ALT: 13 U/L (ref 0–35)
AST: 30 U/L (ref 0–37)
Albumin: 4.4 g/dL (ref 3.5–5.2)
Bilirubin, Direct: 0.2 mg/dL (ref 0.0–0.3)
Total Protein: 7 g/dL (ref 6.0–8.3)

## 2012-11-07 MED ORDER — TERBINAFINE HCL 250 MG PO TABS: 250.0000 mg | ORAL_TABLET | Freq: Every day | ORAL | Status: AC

## 2012-11-07 NOTE — Patient Instructions (Addendum)
F/u in 4.5 month  You are referred for toenail cutting  You are to start one tablet once daily for fungal toenail infection  Hepatic panel today and chem 7  No changes in current  medication.  You need to take medication when it is given to you at the facility

## 2012-11-07 NOTE — Assessment & Plan Note (Signed)
Unchanged, requires adult diapers

## 2012-11-07 NOTE — Assessment & Plan Note (Signed)
Controlled, no change in medication  

## 2012-11-07 NOTE — Assessment & Plan Note (Signed)
Severe, check hepatic panel and treat for 4 months with oral agent. Podiatry for toenail clipping

## 2012-11-07 NOTE — Assessment & Plan Note (Addendum)
Controlled, no change in medication  

## 2012-11-07 NOTE — Progress Notes (Signed)
  Subjective:    Patient ID: [name], female    DOB: Dec 07, 1905, 78 y.o.   MRN: [id number]  HPI Pt brought in by caregiver for f/u of her chronic problems. She has been doing fairly well.  Noted to wander at night drinking warm water to move her bowels, unable to modify behavior due to dementia. Pt has a fair appetite, no recent blood in stool or black stool noted. No falls. Pt c/o hyperpigmented spots on skin , states due to bleeding, exam is benign and I reassure her of this. C/o excessively long and thickened toenails   Review of Systems See HPI Denies recent fever or chills. Denies  nasal congestion, ear pain or sore throat. Denies chest congestion, productive cough or wheezing. Denies chest pains,  and leg swelling Denies abdominal pain, nausea, vomiting,diarrhea or constipation.   Denies dysuria, or malodorous urine. Chronic incontinenece and requires adult diapers Chronic joint pain,and limitation in mobility.Uses a cane  Denies skin break down or rash.        Objective:   Physical Exam  Patient alert  and in no cardiopulmonary distress.  HEENT: No facial asymmetry, EOMI, no sinus tenderness,  oropharynx pink and moist.  Neck decreased though adequate ROM no adenopathy.  Chest: Clear to auscultation bilaterally.  CVS: S1, S2 no murmurs, no S3.  ABD: Soft non tender. Bowel sounds normal.  Ext: No edema  MS: decreased  ROM spine, shoulders, hips and knees.  Skin: Intact, no ulcerations or rash noted.thickened excessively long toenails bilaterally  Psych: Good eye contact, normal affect.  not anxious or depressed appearing.  CNS: CN 2-12 intact, power,  normal throughout.       Assessment & Plan:

## 2012-11-09 NOTE — Assessment & Plan Note (Signed)
Age appropriate arthritis, uses a cane, no falls

## 2012-12-05 ENCOUNTER — Other Ambulatory Visit: Payer: Self-pay | Admitting: Family Medicine

## 2013-01-05 ENCOUNTER — Telehealth: Payer: Self-pay | Admitting: Family Medicine

## 2013-01-05 NOTE — Telephone Encounter (Signed)
Dr. Lodema Hong to d/c it

## 2013-01-06 ENCOUNTER — Other Ambulatory Visit: Payer: Self-pay | Admitting: Family Medicine

## 2013-01-06 NOTE — Telephone Encounter (Signed)
Will fax d/c order to [facility]

## 2013-01-28 DIAGNOSIS — F0390 Unspecified dementia without behavioral disturbance: Secondary | ICD-10-CM

## 2013-01-28 DIAGNOSIS — F411 Generalized anxiety disorder: Secondary | ICD-10-CM

## 2013-01-28 DIAGNOSIS — R32 Unspecified urinary incontinence: Secondary | ICD-10-CM

## 2013-01-28 DIAGNOSIS — I10 Essential (primary) hypertension: Secondary | ICD-10-CM

## 2013-03-03 ENCOUNTER — Telehealth: Payer: Self-pay | Admitting: Family Medicine

## 2013-03-09 ENCOUNTER — Telehealth: Payer: Self-pay | Admitting: Family Medicine

## 2013-03-10 NOTE — Telephone Encounter (Signed)
See next telephone msg   

## 2013-03-10 NOTE — Telephone Encounter (Signed)
[name] states she is having a good day today and her bowels have moved and she is fine. Told her to call back if anything changes

## 2013-03-27 ENCOUNTER — Ambulatory Visit: Payer: Medicare Other | Admitting: Family Medicine

## 2013-04-01 ENCOUNTER — Encounter: Payer: Self-pay | Admitting: Family Medicine

## 2013-04-01 ENCOUNTER — Ambulatory Visit (INDEPENDENT_AMBULATORY_CARE_PROVIDER_SITE_OTHER): Payer: Medicare Other | Admitting: Family Medicine

## 2013-04-01 VITALS — BP 140/80 | HR 94 | Resp 18 | Ht 62.0 in | Wt 97.0 lb

## 2013-04-01 DIAGNOSIS — M479 Spondylosis, unspecified: Secondary | ICD-10-CM

## 2013-04-01 DIAGNOSIS — K5730 Diverticulosis of large intestine without perforation or abscess without bleeding: Secondary | ICD-10-CM

## 2013-04-01 DIAGNOSIS — I10 Essential (primary) hypertension: Secondary | ICD-10-CM

## 2013-04-01 DIAGNOSIS — R5383 Other fatigue: Secondary | ICD-10-CM

## 2013-04-01 DIAGNOSIS — F411 Generalized anxiety disorder: Secondary | ICD-10-CM

## 2013-04-01 DIAGNOSIS — R32 Unspecified urinary incontinence: Secondary | ICD-10-CM

## 2013-04-01 NOTE — Progress Notes (Signed)
  Subjective:    Patient ID: [name], female    DOB: 1906/10/17, 79 y.o.   MRN: [id number]  HPI The PT is here for follow up and re-evaluation of chronic medical conditions, medication management and review of any available recent lab and radiology data.  Preventive health is updated, specifically  Cancer screening and Immunization.   C/o fatigue    Review of Systems See HPI Denies recent fever or chills. Denies sinus pressure, nasal congestion, ear pain or sore throat. Denies chest congestion, productive cough or wheezing. Denies chest pains, palpitations and leg swelling Denies abdominal pain, nausea, vomiting,diarrhea or constipation.   Denies dysuria, frequency, hesitancy or incontinence.  Denies headaches, seizures, numbness, or tingling. Denies skin break down or rash.        Objective:   Physical Exam Patient alert and oriented and in no cardiopulmonary distress.  HEENT: No facial asymmetry, EOMI, no sinus tenderness,  oropharynx pink and moist.  Neck decreased ROM no adenopathy.  Chest: Clear to auscultation bilaterally.  CVS: S1, S2 no murmurs, no S3.  ABD: Soft non tender. Bowel sounds normal.  Ext: No edema  MS: decreased ROM spine, shoulders, hips and knees.  Skin: Intact, no ulcerations or rash noted.  Psych: Good eye contact, normal affect. Memory impaired  not anxious or depressed appearing.  CNS: CN 2-12 intact, power, normal throughout.        Assessment & Plan:

## 2013-04-01 NOTE — Patient Instructions (Addendum)
F/u end September.Call if you need me before  Fasting lipid, chem 7, CBC end September before visit.  STOP colace and multivitamin  Take blood pressure pill and pill for anxiety

## 2013-04-11 ENCOUNTER — Emergency Department (HOSPITAL_COMMUNITY)
Admission: EM | Admit: 2013-04-11 | Discharge: 2013-04-11 | Disposition: A | Discharge status: Home / Self Care | Payer: Medicare Other | Attending: Emergency Medicine | Admitting: Emergency Medicine

## 2013-04-11 ENCOUNTER — Encounter (HOSPITAL_COMMUNITY): Payer: Self-pay | Admitting: *Deleted

## 2013-04-11 ENCOUNTER — Other Ambulatory Visit: Payer: Self-pay | Admitting: Family Medicine

## 2013-04-11 DIAGNOSIS — Y939 Activity, unspecified: Secondary | ICD-10-CM | POA: Insufficient documentation

## 2013-04-11 DIAGNOSIS — Z79899 Other long term (current) drug therapy: Secondary | ICD-10-CM | POA: Insufficient documentation

## 2013-04-11 DIAGNOSIS — I10 Essential (primary) hypertension: Secondary | ICD-10-CM | POA: Insufficient documentation

## 2013-04-11 DIAGNOSIS — F411 Generalized anxiety disorder: Secondary | ICD-10-CM | POA: Insufficient documentation

## 2013-04-11 DIAGNOSIS — Z8739 Personal history of other diseases of the musculoskeletal system and connective tissue: Secondary | ICD-10-CM | POA: Insufficient documentation

## 2013-04-11 DIAGNOSIS — Y929 Unspecified place or not applicable: Secondary | ICD-10-CM | POA: Insufficient documentation

## 2013-04-11 DIAGNOSIS — S40269A Insect bite (nonvenomous) of unspecified shoulder, initial encounter: Secondary | ICD-10-CM | POA: Insufficient documentation

## 2013-04-11 DIAGNOSIS — F0390 Unspecified dementia without behavioral disturbance: Secondary | ICD-10-CM | POA: Insufficient documentation

## 2013-04-11 DIAGNOSIS — R21 Rash and other nonspecific skin eruption: Secondary | ICD-10-CM | POA: Insufficient documentation

## 2013-04-11 DIAGNOSIS — Z8719 Personal history of other diseases of the digestive system: Secondary | ICD-10-CM | POA: Insufficient documentation

## 2013-04-11 DIAGNOSIS — W57XXXA Bitten or stung by nonvenomous insect and other nonvenomous arthropods, initial encounter: Secondary | ICD-10-CM | POA: Insufficient documentation

## 2013-04-11 MED ORDER — BACITRACIN 500 UNIT/GM EX OINT: 1.0000 "application " | TOPICAL_OINTMENT | Freq: Two times a day (BID) | CUTANEOUS | Status: DC

## 2013-04-11 MED ORDER — DOXYCYCLINE HYCLATE 100 MG PO CAPS: 100.0000 mg | ORAL_CAPSULE | Freq: Two times a day (BID) | ORAL | Status: DC

## 2013-04-11 MED ORDER — BACITRACIN ZINC 500 UNIT/GM EX OINT
TOPICAL_OINTMENT | CUTANEOUS | Status: AC
  Administered 2013-04-11: 1 via TOPICAL
  Filled 2013-04-11: qty 0.9

## 2013-04-11 NOTE — ED Notes (Signed)
Patient with no complaints at this time. Respirations even and unlabored. Skin warm/dry. Discharge instructions reviewed with patient at this time. Patient given opportunity to voice concerns/ask questions. Patient discharged at this time and left Emergency Department with steady gait.   

## 2013-04-11 NOTE — ED Notes (Signed)
Tick removed by MD

## 2013-04-11 NOTE — ED Provider Notes (Signed)
History    This chart was scribed for Vida Roller, MD by Leone Payor, ED Scribe. This patient was seen in room APA01/APA01 and the patient's care was started 12:53 PM.   CSN: [id number]  Arrival date & time 04/11/13  1200   First MD Initiated Contact with Patient 04/11/13 1237      Chief Complaint  Patient presents with  . Pain     The history is provided by the patient. No language interpreter was used.    HPI Comments: [name] is a 79 y.o. female who presents to the Emergency Department complaining of a rash to L axilla from an attached tick. Pt states the rash and associated pain started about 2 weeks ago, has been constant, mild and gradually worsening. She denies any drainage. Not associated with fevers. Pain is worse to touch. She denies being in the woods or being near any animals.    Past Medical History  Diagnosis Date  . Diverticular disease   . Anxiety   . Dementia   . DJD (degenerative joint disease)   . Hypertension     Past Surgical History  Procedure Laterality Date  . Abdominal surgery      No family history on file.  History  Substance Use Topics  . Smoking status: Never Smoker   . Smokeless tobacco: Not on file  . Alcohol Use: No    OB History   Grav Para Term Preterm Abortions TAB SAB Ect Mult Living                  Review of Systems  Constitutional: Negative for fever.  Skin: Positive for rash.    Allergies  Review of patient's allergies indicates no known allergies.  Home Medications   Current Outpatient Rx  Name  Route  Sig  Dispense  Refill  . ALPRAZolam (XANAX) 0.25 MG tablet   Oral   Take 0.25 mg by mouth at bedtime.         Marland Kitchen amLODipine-benazepril (LOTREL) 10-20 MG per capsule   Oral   Take 1 capsule by mouth daily.         Marland Kitchen doxycycline (VIBRAMYCIN) 100 MG capsule   Oral   Take 1 capsule (100 mg total) by mouth 2 (two) times daily.   14 capsule   0     BP 137/69  Pulse 77  Temp(Src) 97.4  F (36.3 C) (Oral)  Resp 20  SpO2 97%  Physical Exam  Nursing note and vitals reviewed. Constitutional: She is oriented to person, place, and time. She appears well-developed and well-nourished. No distress.  HENT:  Head: Normocephalic and atraumatic.  Eyes: EOM are normal.  Neck: Neck supple. No tracheal deviation present.  Cardiovascular: Normal rate.   Pulmonary/Chest: Effort normal. No respiratory distress.  Musculoskeletal: Normal range of motion.  Neurological: She is alert and oriented to person, place, and time.  Skin: Skin is warm and dry.  Minimal redness surrounding the imbedded tick located at the midaxillary line on the proximal L chest wall  Psychiatric: She has a normal mood and affect. Her behavior is normal.    ED Course  Procedures (including critical care time)  DIAGNOSTIC STUDIES: Oxygen Saturation is 97% on room air, adequate by my interpretation.    COORDINATION OF CARE: 12:53 PM Discussed treatment plan with pt at bedside and pt agreed to plan.   Labs Reviewed - No data to display No results found.   1. Tick bite  MDM  Tick was removed manually without difficulty - pt has no associated cellulitis but the tick was deeply embedded and given the wound and the tick bite, will treat with abx.  Pt stable for d/c.  No signs of RMSF or lymes.    Meds given in ED:  Medications  bacitracin ointment 1 application (1 application Topical Given 04/11/13 1302)    Discharge Medication List as of 04/11/2013 12:54 PM    START taking these medications   Details  doxycycline (VIBRAMYCIN) 100 MG capsule Take 1 capsule (100 mg total) by mouth 2 (two) times daily., Starting 04/11/2013, Until Discontinued, Print        I personally performed the services described in this documentation, which was scribed in my presence. The recorded information has been reviewed and is accurate.        Vida Roller, MD 04/11/13 [phone]

## 2013-04-11 NOTE — ED Notes (Signed)
Pt has mole to left axilla area that has started to become painful, area red, painful to touch. Denies any drainage.

## 2013-04-11 NOTE — ED Notes (Signed)
Prescription called in to [facility] per Patient request.

## 2013-04-12 NOTE — Assessment & Plan Note (Signed)
Controlled, no change in medication  

## 2013-04-12 NOTE — Assessment & Plan Note (Signed)
Unchanged, continue xanax

## 2013-04-12 NOTE — Assessment & Plan Note (Signed)
Chronic arthritis with impaired mobility, no h/o falls

## 2013-04-12 NOTE — Assessment & Plan Note (Addendum)
Deteriorated, check labs before next visit

## 2013-04-12 NOTE — Assessment & Plan Note (Signed)
ongoing need for incontinence  undergarments , unchnaged

## 2013-04-12 NOTE — Assessment & Plan Note (Signed)
No recent rectal bleed

## 2013-07-01 ENCOUNTER — Encounter: Payer: Self-pay | Admitting: Family Medicine

## 2013-07-01 ENCOUNTER — Ambulatory Visit (INDEPENDENT_AMBULATORY_CARE_PROVIDER_SITE_OTHER): Payer: Medicare Other | Admitting: Family Medicine

## 2013-07-01 VITALS — BP 138/72 | HR 88 | Resp 16 | Wt 99.0 lb

## 2013-07-01 DIAGNOSIS — F068 Other specified mental disorders due to known physiological condition: Secondary | ICD-10-CM

## 2013-07-01 DIAGNOSIS — R413 Other amnesia: Secondary | ICD-10-CM | POA: Insufficient documentation

## 2013-07-01 DIAGNOSIS — I10 Essential (primary) hypertension: Secondary | ICD-10-CM

## 2013-07-01 DIAGNOSIS — F411 Generalized anxiety disorder: Secondary | ICD-10-CM

## 2013-07-01 DIAGNOSIS — M479 Spondylosis, unspecified: Secondary | ICD-10-CM

## 2013-07-01 DIAGNOSIS — K5730 Diverticulosis of large intestine without perforation or abscess without bleeding: Secondary | ICD-10-CM

## 2013-07-01 NOTE — Progress Notes (Signed)
Subjective:    Patient ID: Margaret [name], female    DOB: [birth year], 79 y.o.   MRN: [id number]  HPI Pt brought in today by her [name] daughter Margaret [name], for the main reason per Margaret Margaret [name], that an attempt is being made to state that  between 2012 and 2014, Margaret [name] has had marked deterioration in her ability to live independently and care for herself, ever since   she spent from August 2012 to December 2012 in [place] , with a great niece.  Margaret Margaret [name] is upset bcause  she states that Margaret [name] was actually taken out of state without her knowledge , no discussion was had, and she found out the day before she was removed. At that time  Margaret [name] then aged 82 was  still living on her own,  And had been receiving help in the home through the community assistance program , which she was certainly in need of , based on the expected limitations in physical and mental function for someone of her age.   At this point there is major distress on Margaret [name]'s part because she found out    that Margaret [name] life estate is signed over to someone other than herself.Margaret [name] actually brought her lawyer along with his wife hoping that he could sit in on the visit.She states that Margaret [name] also raised her brother. She is not really cetain of any previous will or it's content when I asked her directly if she knew if she had been on Margaret. Bonfield's will prior to 2012.   This is 2nd time I have physically seen Margaret Margaret [name] over the approx 16 years since Margaret [name] has been my patient.The first time  was briefly when Margaret [name] was hospitalized approximately 15 years ago.  The second interaction I had with Margaret [name] was when Margaret [name] returned from [place], when she called asking for help with placement after keeping her for 3 months, since she was aware that care for [name] was more than she could assume on a full time basis. I did help with identifying [name] family care, Margaret [name] already knew the husband who owns the  facility, and Margaret [name] went to live there 3 months after her return to [facility], approx March 2013. [name] has been satisfied  with her care there.  When I directly asked again at the visit today , she states she is happy with Margaret [name]'s care and that Margaret [name] would certainly be returning there.  I had decided that it would be beneficial to bring Margaret. [name] to formally   Document a dementia screen .This, as a response to recent attempts by the lawyer representing Margaret Margaret [name] to "speak directly with me" and generally outlining the  issues of [name]'s estate.     Review of Systems Pt c/o bilateral leg pain.(a chronic complaint of hers) She voiced no other complaints, and a formal review of systems was not performed at the visit    Objective:   Physical Exam Elderly female sitting in a wheelchair in no cardiopulmonary distress.  Pt disoriented in terms of day, date, time and year, when asked location at one time she mumbled that she was in the courthouse and that her lawyer was on his way.She was able to identify Margaret [name] by name, also accurately named the niece who she lived with for the brief period in [place] , but her discussion regarding them both, under her breath,certainly not in response to my questioning, carried "mixed messages" of her feelings about each of them.  Margaret [name] is able to count forwards, but not backwards.  She is unable to recall 3 named objects. She is able to identify 2 objects. Repitition skills are non existent, unable to repeat a simple phrase. Unable to perform a 3 stage command Unable to read and follow simple instructions Unable to write a sentence  Unable to copy a design Total score  For determining cognitive impairment on the day of the visit is 9 with a maximum score of 30, consistent with a diagnosis of severe cognitive impairment         Assessment & Plan:  Elderly patient aged 79, with severe cognitive impairment, with no acute decline in level of  function over  The past 4 years.  Patient also has had physical limitations in mobility which have progressively worsened, challenging her ability to live independently  and safely.She has  lived  on her own for a much longer  duration of time, nearly 102 years,  Than would be reasonably expected and encouraged.    Attention to good  Nutrition, and  safety ,both  Physical as well as mental and emotoinal are what Margaret. Jackson needs an deserves.

## 2013-07-01 NOTE — Patient Instructions (Addendum)
F/u in 3 month, call if you need me before  No changes in medication at this time.   Be careful when getting around , so that you do not fall

## 2013-07-05 IMAGING — CR DG CHEST 1V PORT
1 series · 1 of 1 positions shown · non-contrast
Comparison: 04/25/2007

CLINICAL DATA: Weakness.

PORTABLE CHEST - 1 VIEW

[view not recorded]
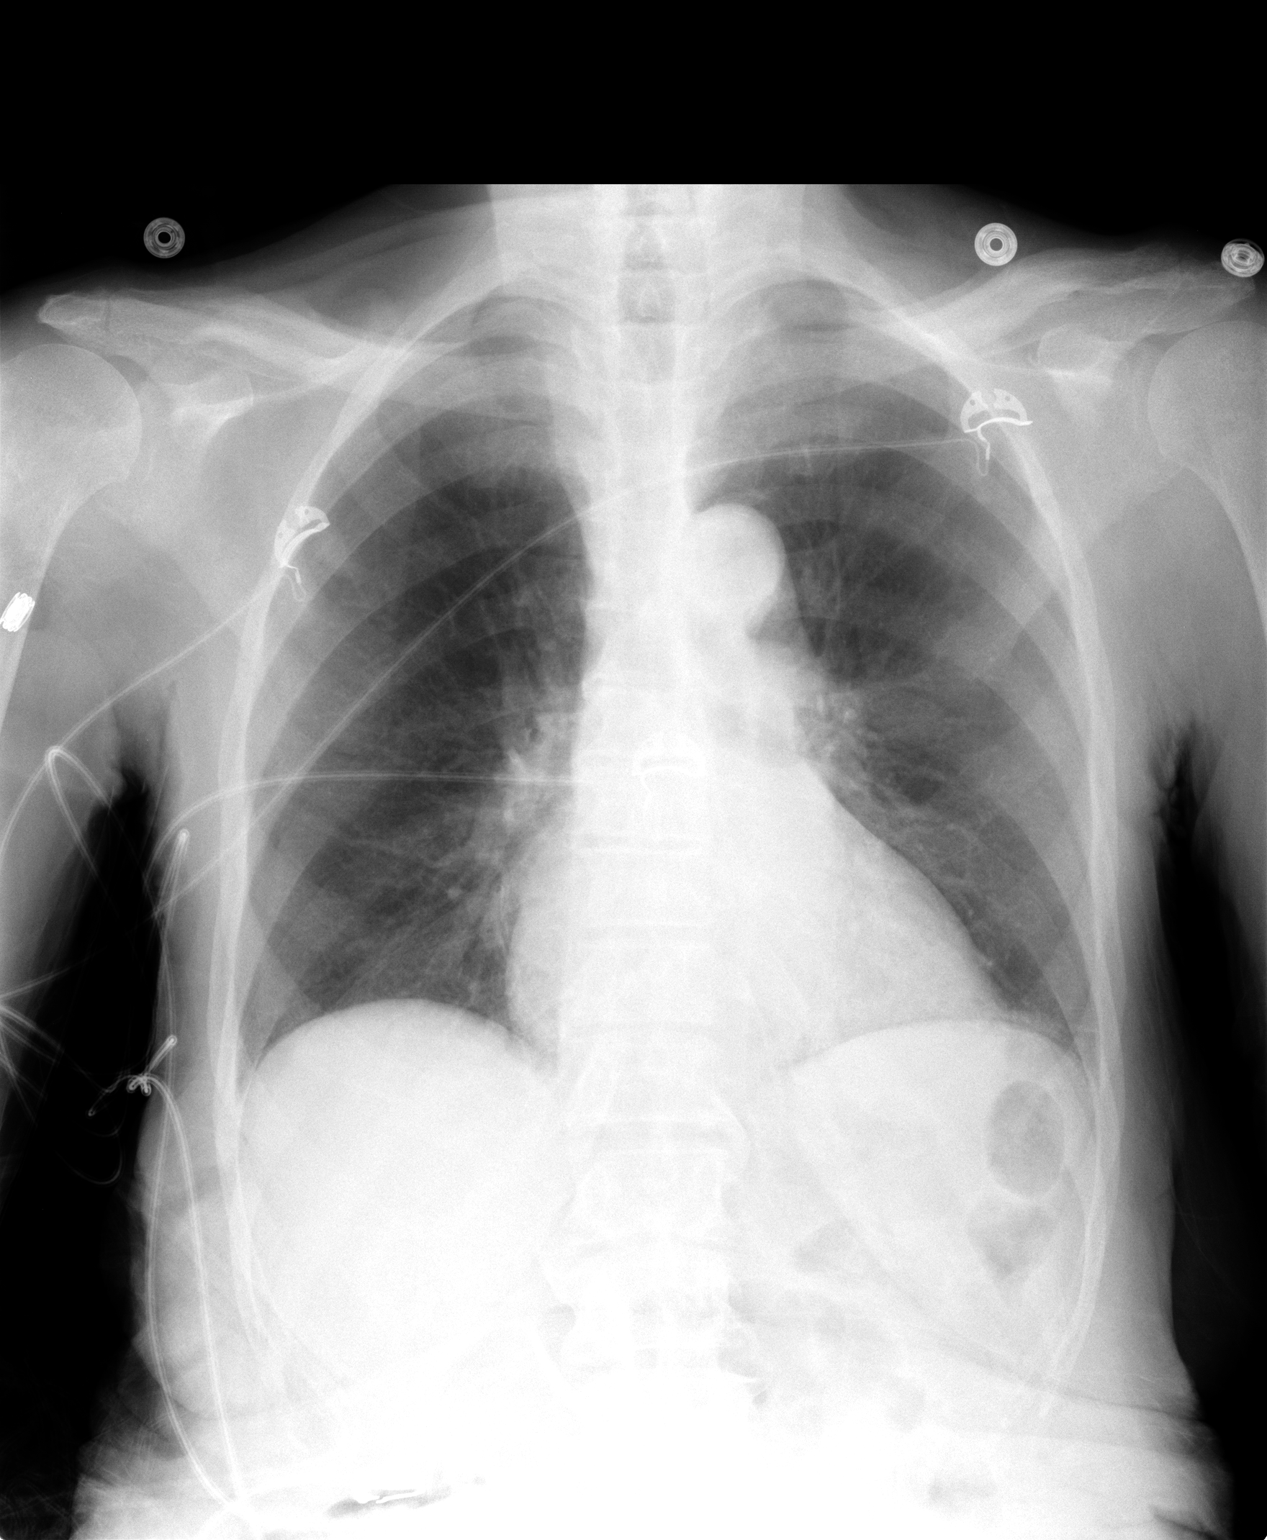

[1 of 1 positions shown; findings below may reference images not displayed]

FINDINGS: Heart is mildly enlarged.  No confluent airspace
opacities or effusions.  No bony abnormality.
IMPRESSION: Stable cardiomegaly.  No active disease.

## 2013-07-05 NOTE — Assessment & Plan Note (Signed)
Continue low dose xanax at bedtime to assist with sleep

## 2013-07-05 NOTE — Assessment & Plan Note (Signed)
No bleed requirng hospitalization in the past 8 months

## 2013-07-05 NOTE — Assessment & Plan Note (Addendum)
Severe dementia with no h/o wandering or potentially harmful behavior. No medication indicated at this time  based on her  age and behavior . Needs to live in a supervised setting with 24 hour care

## 2013-07-05 NOTE — Assessment & Plan Note (Signed)
Severe generalized osteoarthritis, limiting ability to safely ambulate independently. Independent living at this age and with this limitation is not an option

## 2013-07-05 NOTE — Assessment & Plan Note (Signed)
Controlled, no change in medication  

## 2013-07-06 ENCOUNTER — Telehealth: Payer: Self-pay | Admitting: Family Medicine

## 2013-07-06 NOTE — Telephone Encounter (Signed)
noted 

## 2013-07-07 ENCOUNTER — Ambulatory Visit: Payer: Medicare Other | Admitting: Family Medicine

## 2013-07-15 ENCOUNTER — Other Ambulatory Visit: Payer: Self-pay | Admitting: Family Medicine

## 2013-07-22 DIAGNOSIS — I10 Essential (primary) hypertension: Secondary | ICD-10-CM

## 2013-07-22 DIAGNOSIS — F0390 Unspecified dementia without behavioral disturbance: Secondary | ICD-10-CM

## 2013-07-22 DIAGNOSIS — R32 Unspecified urinary incontinence: Secondary | ICD-10-CM

## 2013-08-10 ENCOUNTER — Ambulatory Visit: Payer: Medicare Other | Admitting: Family Medicine

## 2013-09-11 DIAGNOSIS — F0390 Unspecified dementia without behavioral disturbance: Secondary | ICD-10-CM

## 2013-09-11 DIAGNOSIS — R32 Unspecified urinary incontinence: Secondary | ICD-10-CM

## 2013-09-11 DIAGNOSIS — F411 Generalized anxiety disorder: Secondary | ICD-10-CM

## 2013-09-11 DIAGNOSIS — I10 Essential (primary) hypertension: Secondary | ICD-10-CM

## 2013-09-14 ENCOUNTER — Other Ambulatory Visit: Payer: Self-pay | Admitting: Family Medicine

## 2013-10-05 ENCOUNTER — Encounter (INDEPENDENT_AMBULATORY_CARE_PROVIDER_SITE_OTHER): Payer: Self-pay

## 2013-10-05 ENCOUNTER — Encounter: Payer: Self-pay | Admitting: Family Medicine

## 2013-10-05 ENCOUNTER — Ambulatory Visit (INDEPENDENT_AMBULATORY_CARE_PROVIDER_SITE_OTHER): Payer: Medicare Other | Admitting: Family Medicine

## 2013-10-05 VITALS — BP 148/70 | HR 70 | Resp 16 | Ht 62.0 in | Wt 94.1 lb

## 2013-10-05 DIAGNOSIS — F411 Generalized anxiety disorder: Secondary | ICD-10-CM

## 2013-10-05 DIAGNOSIS — Z23 Encounter for immunization: Secondary | ICD-10-CM

## 2013-10-05 DIAGNOSIS — R634 Abnormal weight loss: Secondary | ICD-10-CM

## 2013-10-05 DIAGNOSIS — I10 Essential (primary) hypertension: Secondary | ICD-10-CM

## 2013-10-05 DIAGNOSIS — M479 Spondylosis, unspecified: Secondary | ICD-10-CM

## 2013-10-05 DIAGNOSIS — R32 Unspecified urinary incontinence: Secondary | ICD-10-CM

## 2013-10-05 DIAGNOSIS — F068 Other specified mental disorders due to known physiological condition: Secondary | ICD-10-CM

## 2013-10-05 DIAGNOSIS — K5730 Diverticulosis of large intestine without perforation or abscess without bleeding: Secondary | ICD-10-CM

## 2013-10-05 DIAGNOSIS — M899 Disorder of bone, unspecified: Secondary | ICD-10-CM

## 2013-10-05 MED ORDER — MIRTAZAPINE 7.5 MG PO TABS: 7.5000 mg | ORAL_TABLET | Freq: Every day | ORAL | Status: DC

## 2013-10-05 NOTE — Progress Notes (Signed)
  Subjective:    Patient ID: [name], female    DOB: Mar 08, 1906, 79 y.o.   MRN: [id number]  HPI Pt in with her caregiver, [name], for  Routine follow up . Of note, Ms [name] is the person who she has lived with for over 2 years , in rhe family home that is owned by [name] and her husband , [name]. Pt states she feels well, denies any fever, chills, cough , chest congestion. Denies constipation, uses a cane to help her when she walks , but denies any falls or significant joint pain. [name] depression, states she is "blessed" and happy.   Answers provided by pt were verified with her caregiver as patient is an unreliable historian due to dementia. Ms. [name] states that pt continues to eat small amounts of food, and this is a concern as  Well as  The fact that she often does not rest well at night. Ms [name] does not believe that [name] is depressed her behavior is essentially unchanged, she is not noted to be withdrawn or crying   Review of Systems See HPI. History verified with caregiver Denies recent fever or chills. Denies sinus pressure, nasal congestion, ear pain or sore throat. Denies chest congestion, productive cough or wheezing. Denies chest pains,  and leg swelling Denies abdominal pain, nausea, vomiting,diarrhea or constipation.   Denies dysuria, frequency, hesitancy , has  Incontinence   Denies headaches,  Denies skin break down or rash.        Objective:   Physical Exam  Patient alert  and in no cardiopulmonary distress.  HEENT: No facial asymmetry, EOMI, no sinus tenderness,  oropharynx pink and moist.  Neck decreased ROM, no adenopathy.  Chest: Clear to auscultation bilaterally.  CVS: S1, S2 no murmurs, no S3.  ABD: Soft non tender. Bowel sounds normal.  Ext: No edema  MS: Decreased  ROM spine, shoulders, hips and knees.  Skin: Intact, no ulcerations or rash noted.  Psych: Good eye contact, normal affect. Memory loss,  not anxious or depressed appearing.  CNS: CN 2-12 intact, power, normal throughout.       Assessment & Plan:

## 2013-10-05 NOTE — Patient Instructions (Addendum)
F/u in 4 month, call if you need me before  Flu and pneumonia vaccines today  Labs today are CBc, chem 7 and vitamin D  New additional medication to help with rest and appetite.  Drink 1 ensure every day, if possible since you have lost weight

## 2013-10-10 NOTE — Assessment & Plan Note (Signed)
Controlled, no change in medication  

## 2013-10-10 NOTE — Assessment & Plan Note (Signed)
Poor appetite with weight loss, eats small amounts often. Sleep needs to be improved also, trial of  remeron low dose . Pt denies  Being depressed, there is no noted change in her behavior or withdrawal , constantly talking while awake per caregiver, Greig Castilla who accompanies her to this visit

## 2013-10-10 NOTE — Assessment & Plan Note (Signed)
Incapable of bladder control coordinating with mobility , ongoing dependence on incontinence supplies, lifetime need

## 2013-10-10 NOTE — Assessment & Plan Note (Signed)
No recent rectal bleeding 

## 2013-10-10 NOTE — Assessment & Plan Note (Signed)
unchanged , pt on no medication , and will not be started a on any at this time. No significant behavioral disturbance reported where she poses a threat to herself or to anyone

## 2013-10-10 NOTE — Assessment & Plan Note (Signed)
contoled on low dose xanax which she takes at bedtime

## 2013-11-10 ENCOUNTER — Other Ambulatory Visit: Payer: Self-pay | Admitting: Family Medicine

## 2013-11-20 DIAGNOSIS — R32 Unspecified urinary incontinence: Secondary | ICD-10-CM

## 2013-11-20 DIAGNOSIS — F0390 Unspecified dementia without behavioral disturbance: Secondary | ICD-10-CM

## 2013-11-20 DIAGNOSIS — I10 Essential (primary) hypertension: Secondary | ICD-10-CM

## 2013-11-20 DIAGNOSIS — F411 Generalized anxiety disorder: Secondary | ICD-10-CM

## 2014-01-08 DIAGNOSIS — F0390 Unspecified dementia without behavioral disturbance: Secondary | ICD-10-CM

## 2014-01-08 DIAGNOSIS — F411 Generalized anxiety disorder: Secondary | ICD-10-CM

## 2014-01-08 DIAGNOSIS — R32 Unspecified urinary incontinence: Secondary | ICD-10-CM

## 2014-01-08 DIAGNOSIS — I10 Essential (primary) hypertension: Secondary | ICD-10-CM

## 2014-01-19 ENCOUNTER — Telehealth: Payer: Self-pay

## 2014-01-19 NOTE — Telephone Encounter (Signed)
Called in, pt reportedly dong "alright now". Clear runny nose has stopped, no fever documented, no chills, non cough or nasal drainage currently No medication recommended or sent in, advised to call back if condition changes

## 2014-02-02 ENCOUNTER — Ambulatory Visit: Payer: Medicare Other | Admitting: Family Medicine

## 2014-02-08 ENCOUNTER — Other Ambulatory Visit: Payer: Self-pay | Admitting: Family Medicine

## 2014-03-11 ENCOUNTER — Ambulatory Visit (INDEPENDENT_AMBULATORY_CARE_PROVIDER_SITE_OTHER): Payer: Medicare Other | Admitting: Family Medicine

## 2014-03-11 ENCOUNTER — Encounter (INDEPENDENT_AMBULATORY_CARE_PROVIDER_SITE_OTHER): Payer: Self-pay

## 2014-03-11 ENCOUNTER — Encounter: Payer: Self-pay | Admitting: Family Medicine

## 2014-03-11 VITALS — BP 140/64 | Resp 18 | Ht 62.0 in | Wt 103.1 lb

## 2014-03-11 DIAGNOSIS — R5381 Other malaise: Secondary | ICD-10-CM

## 2014-03-11 DIAGNOSIS — E559 Vitamin D deficiency, unspecified: Secondary | ICD-10-CM

## 2014-03-11 DIAGNOSIS — R634 Abnormal weight loss: Secondary | ICD-10-CM

## 2014-03-11 DIAGNOSIS — F411 Generalized anxiety disorder: Secondary | ICD-10-CM

## 2014-03-11 DIAGNOSIS — M899 Disorder of bone, unspecified: Secondary | ICD-10-CM

## 2014-03-11 DIAGNOSIS — M949 Disorder of cartilage, unspecified: Secondary | ICD-10-CM

## 2014-03-11 DIAGNOSIS — I10 Essential (primary) hypertension: Secondary | ICD-10-CM

## 2014-03-11 DIAGNOSIS — F068 Other specified mental disorders due to known physiological condition: Secondary | ICD-10-CM

## 2014-03-11 DIAGNOSIS — R5383 Other fatigue: Secondary | ICD-10-CM

## 2014-03-11 DIAGNOSIS — L84 Corns and callosities: Secondary | ICD-10-CM

## 2014-03-11 DIAGNOSIS — B351 Tinea unguium: Secondary | ICD-10-CM

## 2014-03-11 DIAGNOSIS — R32 Unspecified urinary incontinence: Secondary | ICD-10-CM

## 2014-03-11 LAB — CBC
HCT: 39.7 % (ref 36.0–46.0)
Hemoglobin: 13.6 g/dL (ref 12.0–15.0)
MCH: 29.8 pg (ref 26.0–34.0)
MCHC: 34.3 g/dL (ref 30.0–36.0)
MCV: 86.9 fL (ref 78.0–100.0)
PLATELETS: 223 10*3/uL (ref 150–400)
RBC: 4.57 MIL/uL (ref 3.87–5.11)
RDW: 14 % (ref 11.5–15.5)
WBC: 4.6 10*3/uL (ref 4.0–10.5)

## 2014-03-11 LAB — COMPREHENSIVE METABOLIC PANEL
ALBUMIN: 4.1 g/dL (ref 3.5–5.2)
ALT: 12 U/L (ref 0–35)
AST: 23 U/L (ref 0–37)
Alkaline Phosphatase: 152 U/L — ABNORMAL HIGH (ref 39–117)
BUN: 20 mg/dL (ref 6–23)
CALCIUM: 9.1 mg/dL (ref 8.4–10.5)
CHLORIDE: 105 meq/L (ref 96–112)
CO2: 22 mEq/L (ref 19–32)
Creat: 1.24 mg/dL — ABNORMAL HIGH (ref 0.50–1.10)
Glucose, Bld: 156 mg/dL — ABNORMAL HIGH (ref 70–99)
POTASSIUM: 4.4 meq/L (ref 3.5–5.3)
SODIUM: 139 meq/L (ref 135–145)
Total Bilirubin: 0.9 mg/dL (ref 0.2–1.2)
Total Protein: 6.6 g/dL (ref 6.0–8.3)

## 2014-03-11 LAB — LIPID PANEL
CHOL/HDL RATIO: 3.2 ratio
Cholesterol: 203 mg/dL — ABNORMAL HIGH (ref 0–200)
HDL: 64 mg/dL (ref >39)
LDL CALC: 119 mg/dL — AB (ref 0–99)
Triglycerides: 98 mg/dL (ref <150)
VLDL: 20 mg/dL (ref 0–40)

## 2014-03-11 LAB — TSH: TSH: 0.7 u[IU]/mL (ref 0.350–4.500)

## 2014-03-11 NOTE — Patient Instructions (Signed)
Annual wellness in 4 month, call if you need me before  Labs today, CBC, cmp, TSH , vit D and cholesterol   Doing extremely well, keep it up

## 2014-03-12 ENCOUNTER — Other Ambulatory Visit: Payer: Self-pay

## 2014-03-12 DIAGNOSIS — B351 Tinea unguium: Secondary | ICD-10-CM | POA: Insufficient documentation

## 2014-03-12 DIAGNOSIS — E559 Vitamin D deficiency, unspecified: Secondary | ICD-10-CM | POA: Insufficient documentation

## 2014-03-12 DIAGNOSIS — L84 Corns and callosities: Secondary | ICD-10-CM | POA: Insufficient documentation

## 2014-03-12 LAB — VITAMIN D 25 HYDROXY (VIT D DEFICIENCY, FRACTURES): Vit D, 25-Hydroxy: 12 ng/mL — ABNORMAL LOW (ref 30–89)

## 2014-03-12 MED ORDER — VITAMIN D (ERGOCALCIFEROL) 1.25 MG (50000 UNIT) PO CAPS: 50000.0000 [IU] | ORAL_CAPSULE | ORAL | Status: DC

## 2014-03-12 NOTE — Assessment & Plan Note (Signed)
Controlled and improved with the addition of remron

## 2014-03-12 NOTE — Assessment & Plan Note (Signed)
Severe with tinea pedis , no bacteriual skin infection, no skin ulcers, no medication due to advanced age, continue good foot care

## 2014-03-12 NOTE — Assessment & Plan Note (Signed)
Improved with remron

## 2014-03-12 NOTE — Assessment & Plan Note (Signed)
Will need to start weekly medication

## 2014-03-12 NOTE — Assessment & Plan Note (Signed)
Unchanged, at pt's age no medication indicated

## 2014-03-12 NOTE — Assessment & Plan Note (Signed)
Callus lateral aspect of right foot associated with discomfort, conservative management only, recently evaluated by podiatry

## 2014-03-12 NOTE — Assessment & Plan Note (Signed)
Controlled, no change in medication  

## 2014-03-12 NOTE — Progress Notes (Signed)
   Subjective:    Patient ID: [name], female    DOB: 08/18/1906, 80 y.o.   MRN: [id number]  HPI Pt in for re evaluation of chronic conditions. She is accompanied by her caregiver, [name]. Reportedly doing well, has improved appetite and sleep well with the addition of remeron, no adverse effects noted. Pt c/o painful callus on l side of right foot, she also has severe fungal toenail infection. Was seen by podiatry recently and no plan to do any procedure as far as intervention is concerned. Caregiver reports that pt generally has no complaint re foot pain with the callus, I advised use of a pad/cushion  over the area for pain reduction  Bowel movements ar regular , no rectal bleeding or black stool noted    Review of Systems See HPI, history is from caregiver primarily as pt has severe dementia and is an unreliable historian Denies recent fever or chills. Denies sinus pressure, nasal congestion,  Denies chest congestion, productive cough or wheezing. Denies PND,orthopnea and leg swelling Denies abdominal pain, nausea, vomiting,diarrhea or constipation.   Denies malodorous urine , chronic incontinence  limitation in mobility relies on wheelchair or assistive device for safe mobility. Denies headaches, seizures, numbness, or tingling. Denies depression, uncontrolled  anxiety or insomnia. Thick skin on soles of feet and callus , painful on right sole        Objective:   Physical Exam BP 140/64  Resp 18  Ht 5\' 2"  (1.575 m)  Wt 103 lb 1.9 oz (46.775 kg)  BMI 18.86 kg/m2 Patient alert and oriented and in no cardiopulmonary distress.  HEENT: No facial asymmetry, EOMI, no sinus tenderness,  oropharynx pink and moist.  Neck supple no adenopathy.  Chest: Clear to auscultation bilaterally.  CVS: S1, S2 no murmurs, no S3.  ABD: Soft non tender. Bowel sounds normal.  Ext: No edema  MS: Decreased ROM spine, shoulders, hips and knees.  Skin: Intact, severe  tinea pedis and onychomycosis, callus right foot  Psych: Good eye contact, normal affect. Memory loss not anxious or depressed appearing.  CNS: CN 2-12 intact, power, tone and sensation normal throughout.        Assessment & Plan:  Loss of weight Improved with remron   Incontinence of urine Primarily due to impaired mobility, relies on incontinence supplies  ANXIETY Controlled and improved with the addition of remron  DEMENTIA Unchanged, at pt's age no medication indicated  HYPERTENSION Controlled, no change in medication   Unspecified vitamin D deficiency Will need to start weekly medication  ONYCHOMYCOSIS, BILATERAL Severe with tinea pedis , no bacteriual skin infection, no skin ulcers, no medication due to advanced age, continue good foot care  Callus of foot Callus lateral aspect of right foot associated with discomfort, conservative management only, recently evaluated by podiatry

## 2014-03-12 NOTE — Assessment & Plan Note (Signed)
Primarily due to impaired mobility, relies on incontinence supplies

## 2014-04-13 ENCOUNTER — Other Ambulatory Visit: Payer: Self-pay | Admitting: Family Medicine

## 2014-04-16 ENCOUNTER — Other Ambulatory Visit: Payer: Self-pay

## 2014-04-16 ENCOUNTER — Other Ambulatory Visit: Payer: Self-pay | Admitting: Family Medicine

## 2014-04-16 MED ORDER — ERGOCALCIFEROL 10 MCG (400 UNIT) PO TABS: 800.0000 [IU] | ORAL_TABLET | Freq: Every day | ORAL | Status: DC

## 2014-04-16 MED ORDER — ERGOCALCIFEROL 8000 UNIT/ML PO SOLN: 8000.0000 [IU] | Freq: Every day | ORAL | Status: DC

## 2014-07-12 ENCOUNTER — Encounter: Payer: Medicare Other | Admitting: Family Medicine

## 2014-08-10 ENCOUNTER — Other Ambulatory Visit: Payer: Self-pay | Admitting: Family Medicine

## 2014-08-11 ENCOUNTER — Telehealth: Payer: Self-pay

## 2014-08-11 NOTE — Telephone Encounter (Signed)
Recommend she be taken to the Ed with the new complaints to ensure not dehydrated, not anemic and for evaluation  Of leg sore, no openings available before next week

## 2014-08-11 NOTE — Telephone Encounter (Signed)
Caregiver Valentina GuLucy called in and states that patient c/o headache and some dizziness that started today. Drinking enough fluids and her current BP is 130/50. Wants to know if there is anything else that needs to be done about this. Please advise

## 2014-08-12 NOTE — Telephone Encounter (Signed)
Upon calling [name] back and telling her to take [name] to the ER she then said that this "wasn't" a new complaint and that she says this everyday. I told her no appts until next week and to call back Monday am to get her put in a same day appt slot. She agrees

## 2014-08-13 ENCOUNTER — Emergency Department (HOSPITAL_COMMUNITY)
Admission: EM | Admit: 2014-08-13 | Discharge: 2014-08-13 | Discharge status: Against Medical Advice | Payer: Medicare HMO | Attending: Emergency Medicine | Admitting: Emergency Medicine

## 2014-08-13 ENCOUNTER — Encounter (HOSPITAL_COMMUNITY): Payer: Self-pay | Admitting: Emergency Medicine

## 2014-08-13 DIAGNOSIS — R42 Dizziness and giddiness: Secondary | ICD-10-CM | POA: Diagnosis present

## 2014-08-13 DIAGNOSIS — I10 Essential (primary) hypertension: Secondary | ICD-10-CM | POA: Insufficient documentation

## 2014-08-13 LAB — CBC WITH DIFFERENTIAL/PLATELET
BASOS ABS: 0 10*3/uL (ref 0.0–0.1)
BASOS PCT: 1 % (ref 0–1)
EOS ABS: 0.1 10*3/uL (ref 0.0–0.7)
Eosinophils Relative: 1 % (ref 0–5)
HCT: 37.8 % (ref 36.0–46.0)
Hemoglobin: 13.6 g/dL (ref 12.0–15.0)
Lymphocytes Relative: 22 % (ref 12–46)
Lymphs Abs: 1.3 10*3/uL (ref 0.7–4.0)
MCH: 30.4 pg (ref 26.0–34.0)
MCHC: 36 g/dL (ref 30.0–36.0)
MCV: 84.6 fL (ref 78.0–100.0)
MONO ABS: 0.4 10*3/uL (ref 0.1–1.0)
Monocytes Relative: 8 % (ref 3–12)
NEUTROS ABS: 3.9 10*3/uL (ref 1.7–7.7)
NEUTROS PCT: 68 % (ref 43–77)
Platelets: 210 10*3/uL (ref 150–400)
RBC: 4.47 MIL/uL (ref 3.87–5.11)
RDW: 14.2 % (ref 11.5–15.5)
WBC: 5.7 10*3/uL (ref 4.0–10.5)

## 2014-08-13 LAB — COMPREHENSIVE METABOLIC PANEL
ALBUMIN: 3.8 g/dL (ref 3.5–5.2)
ALT: 10 U/L (ref 0–35)
ANION GAP: 16 — AB (ref 5–15)
AST: 23 U/L (ref 0–37)
Alkaline Phosphatase: 157 U/L — ABNORMAL HIGH (ref 39–117)
BILIRUBIN TOTAL: 0.5 mg/dL (ref 0.3–1.2)
BUN: 20 mg/dL (ref 6–23)
CHLORIDE: 103 meq/L (ref 96–112)
CO2: 22 mEq/L (ref 19–32)
CREATININE: 1.29 mg/dL — AB (ref 0.50–1.10)
Calcium: 9.3 mg/dL (ref 8.4–10.5)
GFR calc Af Amer: 37 mL/min — ABNORMAL LOW (ref >90)
GFR calc non Af Amer: 32 mL/min — ABNORMAL LOW (ref >90)
Glucose, Bld: 185 mg/dL — ABNORMAL HIGH (ref 70–99)
POTASSIUM: 4.1 meq/L (ref 3.7–5.3)
Sodium: 141 mEq/L (ref 137–147)
TOTAL PROTEIN: 7.2 g/dL (ref 6.0–8.3)

## 2014-08-13 NOTE — ED Notes (Addendum)
Dizzy, and nausea for 2 weeks, alert, talking,  MAE.  [facility].

## 2014-08-13 NOTE — ED Notes (Signed)
Registration states pt was getting tired and her family wanted to get home before it got dark so they left

## 2014-10-13 ENCOUNTER — Other Ambulatory Visit: Payer: Self-pay | Admitting: Family Medicine

## 2014-10-19 ENCOUNTER — Other Ambulatory Visit: Payer: Self-pay

## 2014-11-08 ENCOUNTER — Other Ambulatory Visit: Payer: Self-pay | Admitting: Family Medicine

## 2014-12-07 ENCOUNTER — Ambulatory Visit (INDEPENDENT_AMBULATORY_CARE_PROVIDER_SITE_OTHER): Payer: Medicare HMO | Admitting: Family Medicine

## 2014-12-07 ENCOUNTER — Encounter: Payer: Self-pay | Admitting: Family Medicine

## 2014-12-07 VITALS — BP 118/74 | HR 91 | Resp 16 | Ht 62.0 in | Wt 104.0 lb

## 2014-12-07 DIAGNOSIS — Z23 Encounter for immunization: Secondary | ICD-10-CM | POA: Diagnosis not present

## 2014-12-07 DIAGNOSIS — H109 Unspecified conjunctivitis: Secondary | ICD-10-CM

## 2014-12-07 DIAGNOSIS — F419 Anxiety disorder, unspecified: Secondary | ICD-10-CM

## 2014-12-07 DIAGNOSIS — N39498 Other specified urinary incontinence: Secondary | ICD-10-CM

## 2014-12-07 DIAGNOSIS — F0390 Unspecified dementia without behavioral disturbance: Secondary | ICD-10-CM

## 2014-12-07 DIAGNOSIS — I10 Essential (primary) hypertension: Secondary | ICD-10-CM

## 2014-12-07 MED ORDER — HYPROMELLOSE (GONIOSCOPIC) 2.5 % OP SOLN: OPHTHALMIC | Status: DC

## 2014-12-07 NOTE — Progress Notes (Signed)
   Subjective:    Patient ID: [name], female    DOB: 02/18/1906, [age] y.o.   MRN: [id number]  HPI The PT is here for follow up and re-evaluation of chronic medical conditions, medication management and review of any available recent lab and radiology data.  Preventive health is updated, specifically   Immunization.   History is provided by her caregiver who accompanies her, [name], pt incapable due to dementia No h/o  adverse reactions to current medications since the last visit.  Increased watery eyes and crusting noted in the morning over the past 1 to 2 weeks, no redness or pain in eyes Pt reportedly c/o tiredness after walking for a short distance , for the most part she does not ambulate much and certainly  Only with supervision Appetite is improved, she has gained weight, her sleep is good No c/o behavioral problems      Review of Systems See HPI History provided by caregiver, [name] Denies recent fever or chills. Denies sinus pressure, nasal congestion, . Denies chest congestion, productive cough or wheezing. Denies orthopnea, PND and leg swelling Denies abdominal pain, nausea, vomiting,diarrhea or constipation.   Denies dysuria, frequency, hesitancy , she does have incontinence. Chronic  limitation in mobility. Denies skin break down or rash. Relies on xanax as she has done for years to help with sleep        Objective:   Physical Exam  BP 118/74 mmHg  Pulse 91  Resp 16  Ht 5\' 2"  (1.575 m)  Wt 104 lb (47.174 kg)  BMI 19.02 kg/m2  SpO2 99% Patient alert  and in no cardiopulmonary distress.  HEENT: No facial asymmetry, EOMI,   oropharynx pink and moist.  Neck adequate ROM, no JVD, no mass.bilateral crusting of both eyes  Chest: Clear to auscultation bilaterally.  CVS: S1, S2 no murmurs, no S3.Regular rate.  ABD: Soft non tender.   Ext: No edema  MS: Decreased ROM spine, shoulders, hips and knees.  Skin: Intact, no ulcerations  or rash noted.  Psych: Good eye contact, normal affect. Memory loss, not anxious or depressed appearing  CNS: CN 2-12 intact, power,  normal throughout.no focal deficits noted.       Assessment & Plan:  Essential hypertension Controlled, no change in medication    Dementia Pt disoriented to time, and conversation at times has no relationship  To the office visit and her health States she wants to go back tot live in her home on [address], but states she is very content where she lives currently and that she is well cared for. Rambles about a court appearance also, no discussion on these issues are pursued or encouraged at the visit. [name], the lady who owns the family home where she currently lives is present at visit No medication is used in pt of this age with no behavioral disturbance linked to her dementia   Anxiety Controlled, no change in medication Low dose xanax at bedtime to assist with sleep, well tolerated for years   Conjunctivitis of both eyes No evidence of infecion, excess crusting noted in the morning, start artificial tears    Incontinence of urine Ongoing need for incontinence supplies, limited safe mobility contributes to her incontinence   Need for prophylactic vaccination and inoculation against influenza Vaccine administered at visit.

## 2014-12-07 NOTE — Patient Instructions (Addendum)
   Annual wellness in 5 month, call if you need me before  You are doing extremely well  Eye drops will be prescribed for a 7 days, then as needed, this is a moisturizer  Flu vaccine today   Please eat regularly , to help your strength to improve

## 2014-12-12 DIAGNOSIS — Z23 Encounter for immunization: Secondary | ICD-10-CM | POA: Insufficient documentation

## 2014-12-12 NOTE — Assessment & Plan Note (Signed)
Controlled, no change in medication Low dose xanax at bedtime to assist with sleep, well tolerated for years

## 2014-12-12 NOTE — Assessment & Plan Note (Signed)
Ongoing need for incontinence supplies, limited safe mobility contributes to her incontinence

## 2014-12-12 NOTE — Assessment & Plan Note (Signed)
Vaccine administered at visit.  

## 2014-12-12 NOTE — Assessment & Plan Note (Signed)
Pt disoriented to time, and conversation at times has no relationship  To the office visit and her health States she wants to go back tot live in her home on [address], but states she is very content where she lives currently and that she is well cared for. Rambles about a court appearance also, no discussion on these issues are pursued or encouraged at the visit. Ms [name], the lady who owns the family home where she currently lives is present at visit No medication is used in pt of this age with no behavioral disturbance linked to her dementia

## 2014-12-12 NOTE — Assessment & Plan Note (Signed)
Controlled, no change in medication  

## 2014-12-12 NOTE — Assessment & Plan Note (Signed)
No evidence of infecion, excess crusting noted in the morning, start artificial tears

## 2014-12-23 DIAGNOSIS — R32 Unspecified urinary incontinence: Secondary | ICD-10-CM | POA: Diagnosis not present

## 2015-01-11 DIAGNOSIS — I10 Essential (primary) hypertension: Secondary | ICD-10-CM | POA: Diagnosis not present

## 2015-01-12 ENCOUNTER — Other Ambulatory Visit: Payer: Self-pay | Admitting: Family Medicine

## 2015-01-12 DIAGNOSIS — I10 Essential (primary) hypertension: Secondary | ICD-10-CM | POA: Diagnosis not present

## 2015-01-13 DIAGNOSIS — I10 Essential (primary) hypertension: Secondary | ICD-10-CM | POA: Diagnosis not present

## 2015-01-14 DIAGNOSIS — I10 Essential (primary) hypertension: Secondary | ICD-10-CM | POA: Diagnosis not present

## 2015-01-15 DIAGNOSIS — I10 Essential (primary) hypertension: Secondary | ICD-10-CM | POA: Diagnosis not present

## 2015-01-16 DIAGNOSIS — I10 Essential (primary) hypertension: Secondary | ICD-10-CM | POA: Diagnosis not present

## 2015-01-17 DIAGNOSIS — I10 Essential (primary) hypertension: Secondary | ICD-10-CM | POA: Diagnosis not present

## 2015-01-18 DIAGNOSIS — I10 Essential (primary) hypertension: Secondary | ICD-10-CM | POA: Diagnosis not present

## 2015-01-19 DIAGNOSIS — I10 Essential (primary) hypertension: Secondary | ICD-10-CM | POA: Diagnosis not present

## 2015-01-20 DIAGNOSIS — I10 Essential (primary) hypertension: Secondary | ICD-10-CM | POA: Diagnosis not present

## 2015-01-21 DIAGNOSIS — I10 Essential (primary) hypertension: Secondary | ICD-10-CM | POA: Diagnosis not present

## 2015-01-22 DIAGNOSIS — I10 Essential (primary) hypertension: Secondary | ICD-10-CM | POA: Diagnosis not present

## 2015-01-23 DIAGNOSIS — I10 Essential (primary) hypertension: Secondary | ICD-10-CM | POA: Diagnosis not present

## 2015-01-24 DIAGNOSIS — I10 Essential (primary) hypertension: Secondary | ICD-10-CM | POA: Diagnosis not present

## 2015-01-25 DIAGNOSIS — I10 Essential (primary) hypertension: Secondary | ICD-10-CM | POA: Diagnosis not present

## 2015-01-26 DIAGNOSIS — I10 Essential (primary) hypertension: Secondary | ICD-10-CM | POA: Diagnosis not present

## 2015-01-27 DIAGNOSIS — I10 Essential (primary) hypertension: Secondary | ICD-10-CM | POA: Diagnosis not present

## 2015-01-28 DIAGNOSIS — I10 Essential (primary) hypertension: Secondary | ICD-10-CM | POA: Diagnosis not present

## 2015-01-29 DIAGNOSIS — I10 Essential (primary) hypertension: Secondary | ICD-10-CM | POA: Diagnosis not present

## 2015-01-30 DIAGNOSIS — I10 Essential (primary) hypertension: Secondary | ICD-10-CM | POA: Diagnosis not present

## 2015-01-31 DIAGNOSIS — I10 Essential (primary) hypertension: Secondary | ICD-10-CM | POA: Diagnosis not present

## 2015-02-01 DIAGNOSIS — I10 Essential (primary) hypertension: Secondary | ICD-10-CM | POA: Diagnosis not present

## 2015-02-02 DIAGNOSIS — I10 Essential (primary) hypertension: Secondary | ICD-10-CM | POA: Diagnosis not present

## 2015-02-03 DIAGNOSIS — I10 Essential (primary) hypertension: Secondary | ICD-10-CM | POA: Diagnosis not present

## 2015-02-04 DIAGNOSIS — I10 Essential (primary) hypertension: Secondary | ICD-10-CM | POA: Diagnosis not present

## 2015-02-05 DIAGNOSIS — I10 Essential (primary) hypertension: Secondary | ICD-10-CM | POA: Diagnosis not present

## 2015-02-06 DIAGNOSIS — I10 Essential (primary) hypertension: Secondary | ICD-10-CM | POA: Diagnosis not present

## 2015-02-07 DIAGNOSIS — I10 Essential (primary) hypertension: Secondary | ICD-10-CM | POA: Diagnosis not present

## 2015-02-08 DIAGNOSIS — I10 Essential (primary) hypertension: Secondary | ICD-10-CM | POA: Diagnosis not present

## 2015-02-09 DIAGNOSIS — I10 Essential (primary) hypertension: Secondary | ICD-10-CM | POA: Diagnosis not present

## 2015-02-10 DIAGNOSIS — I10 Essential (primary) hypertension: Secondary | ICD-10-CM | POA: Diagnosis not present

## 2015-02-11 DIAGNOSIS — I10 Essential (primary) hypertension: Secondary | ICD-10-CM | POA: Diagnosis not present

## 2015-02-12 DIAGNOSIS — I10 Essential (primary) hypertension: Secondary | ICD-10-CM | POA: Diagnosis not present

## 2015-02-13 DIAGNOSIS — I10 Essential (primary) hypertension: Secondary | ICD-10-CM | POA: Diagnosis not present

## 2015-02-14 DIAGNOSIS — I10 Essential (primary) hypertension: Secondary | ICD-10-CM | POA: Diagnosis not present

## 2015-02-15 DIAGNOSIS — I10 Essential (primary) hypertension: Secondary | ICD-10-CM | POA: Diagnosis not present

## 2015-02-16 DIAGNOSIS — I10 Essential (primary) hypertension: Secondary | ICD-10-CM | POA: Diagnosis not present

## 2015-02-17 DIAGNOSIS — I10 Essential (primary) hypertension: Secondary | ICD-10-CM | POA: Diagnosis not present

## 2015-02-18 DIAGNOSIS — I10 Essential (primary) hypertension: Secondary | ICD-10-CM | POA: Diagnosis not present

## 2015-02-19 DIAGNOSIS — I10 Essential (primary) hypertension: Secondary | ICD-10-CM | POA: Diagnosis not present

## 2015-02-20 DIAGNOSIS — I10 Essential (primary) hypertension: Secondary | ICD-10-CM | POA: Diagnosis not present

## 2015-02-21 DIAGNOSIS — I10 Essential (primary) hypertension: Secondary | ICD-10-CM | POA: Diagnosis not present

## 2015-02-22 DIAGNOSIS — I10 Essential (primary) hypertension: Secondary | ICD-10-CM | POA: Diagnosis not present

## 2015-02-23 DIAGNOSIS — I10 Essential (primary) hypertension: Secondary | ICD-10-CM | POA: Diagnosis not present

## 2015-02-24 DIAGNOSIS — I10 Essential (primary) hypertension: Secondary | ICD-10-CM | POA: Diagnosis not present

## 2015-02-25 DIAGNOSIS — I10 Essential (primary) hypertension: Secondary | ICD-10-CM | POA: Diagnosis not present

## 2015-02-26 DIAGNOSIS — I10 Essential (primary) hypertension: Secondary | ICD-10-CM | POA: Diagnosis not present

## 2015-02-27 DIAGNOSIS — I10 Essential (primary) hypertension: Secondary | ICD-10-CM | POA: Diagnosis not present

## 2015-02-28 DIAGNOSIS — I10 Essential (primary) hypertension: Secondary | ICD-10-CM | POA: Diagnosis not present

## 2015-03-01 DIAGNOSIS — I10 Essential (primary) hypertension: Secondary | ICD-10-CM | POA: Diagnosis not present

## 2015-03-02 DIAGNOSIS — I10 Essential (primary) hypertension: Secondary | ICD-10-CM | POA: Diagnosis not present

## 2015-03-03 DIAGNOSIS — I10 Essential (primary) hypertension: Secondary | ICD-10-CM | POA: Diagnosis not present

## 2015-03-04 DIAGNOSIS — I10 Essential (primary) hypertension: Secondary | ICD-10-CM | POA: Diagnosis not present

## 2015-03-05 DIAGNOSIS — I10 Essential (primary) hypertension: Secondary | ICD-10-CM | POA: Diagnosis not present

## 2015-03-06 DIAGNOSIS — I10 Essential (primary) hypertension: Secondary | ICD-10-CM | POA: Diagnosis not present

## 2015-03-07 DIAGNOSIS — I10 Essential (primary) hypertension: Secondary | ICD-10-CM | POA: Diagnosis not present

## 2015-03-08 DIAGNOSIS — I10 Essential (primary) hypertension: Secondary | ICD-10-CM | POA: Diagnosis not present

## 2015-03-09 DIAGNOSIS — I10 Essential (primary) hypertension: Secondary | ICD-10-CM | POA: Diagnosis not present

## 2015-03-10 DIAGNOSIS — I10 Essential (primary) hypertension: Secondary | ICD-10-CM | POA: Diagnosis not present

## 2015-03-11 DIAGNOSIS — I10 Essential (primary) hypertension: Secondary | ICD-10-CM | POA: Diagnosis not present

## 2015-03-12 DIAGNOSIS — I10 Essential (primary) hypertension: Secondary | ICD-10-CM | POA: Diagnosis not present

## 2015-03-13 DIAGNOSIS — I10 Essential (primary) hypertension: Secondary | ICD-10-CM | POA: Diagnosis not present

## 2015-03-14 DIAGNOSIS — I10 Essential (primary) hypertension: Secondary | ICD-10-CM | POA: Diagnosis not present

## 2015-03-15 DIAGNOSIS — I10 Essential (primary) hypertension: Secondary | ICD-10-CM | POA: Diagnosis not present

## 2015-03-16 DIAGNOSIS — I10 Essential (primary) hypertension: Secondary | ICD-10-CM | POA: Diagnosis not present

## 2015-03-17 DIAGNOSIS — I10 Essential (primary) hypertension: Secondary | ICD-10-CM | POA: Diagnosis not present

## 2015-03-18 DIAGNOSIS — I10 Essential (primary) hypertension: Secondary | ICD-10-CM | POA: Diagnosis not present

## 2015-03-19 DIAGNOSIS — I10 Essential (primary) hypertension: Secondary | ICD-10-CM | POA: Diagnosis not present

## 2015-03-20 DIAGNOSIS — I10 Essential (primary) hypertension: Secondary | ICD-10-CM | POA: Diagnosis not present

## 2015-03-21 DIAGNOSIS — I10 Essential (primary) hypertension: Secondary | ICD-10-CM | POA: Diagnosis not present

## 2015-03-22 DIAGNOSIS — H35363 Drusen (degenerative) of macula, bilateral: Secondary | ICD-10-CM | POA: Diagnosis not present

## 2015-03-22 DIAGNOSIS — I10 Essential (primary) hypertension: Secondary | ICD-10-CM | POA: Diagnosis not present

## 2015-03-23 DIAGNOSIS — I10 Essential (primary) hypertension: Secondary | ICD-10-CM | POA: Diagnosis not present

## 2015-03-24 DIAGNOSIS — I10 Essential (primary) hypertension: Secondary | ICD-10-CM | POA: Diagnosis not present

## 2015-03-25 DIAGNOSIS — I10 Essential (primary) hypertension: Secondary | ICD-10-CM | POA: Diagnosis not present

## 2015-03-26 DIAGNOSIS — I10 Essential (primary) hypertension: Secondary | ICD-10-CM | POA: Diagnosis not present

## 2015-03-27 DIAGNOSIS — I10 Essential (primary) hypertension: Secondary | ICD-10-CM | POA: Diagnosis not present

## 2015-03-28 DIAGNOSIS — I10 Essential (primary) hypertension: Secondary | ICD-10-CM | POA: Diagnosis not present

## 2015-03-29 DIAGNOSIS — I10 Essential (primary) hypertension: Secondary | ICD-10-CM | POA: Diagnosis not present

## 2015-03-30 DIAGNOSIS — I10 Essential (primary) hypertension: Secondary | ICD-10-CM | POA: Diagnosis not present

## 2015-03-31 DIAGNOSIS — I10 Essential (primary) hypertension: Secondary | ICD-10-CM | POA: Diagnosis not present

## 2015-04-01 DIAGNOSIS — I10 Essential (primary) hypertension: Secondary | ICD-10-CM | POA: Diagnosis not present

## 2015-04-02 DIAGNOSIS — I10 Essential (primary) hypertension: Secondary | ICD-10-CM | POA: Diagnosis not present

## 2015-04-03 DIAGNOSIS — I10 Essential (primary) hypertension: Secondary | ICD-10-CM | POA: Diagnosis not present

## 2015-04-04 DIAGNOSIS — I10 Essential (primary) hypertension: Secondary | ICD-10-CM | POA: Diagnosis not present

## 2015-04-05 DIAGNOSIS — I10 Essential (primary) hypertension: Secondary | ICD-10-CM | POA: Diagnosis not present

## 2015-04-06 DIAGNOSIS — I10 Essential (primary) hypertension: Secondary | ICD-10-CM | POA: Diagnosis not present

## 2015-04-07 ENCOUNTER — Other Ambulatory Visit: Payer: Self-pay | Admitting: Family Medicine

## 2015-04-07 DIAGNOSIS — I10 Essential (primary) hypertension: Secondary | ICD-10-CM | POA: Diagnosis not present

## 2015-04-08 DIAGNOSIS — I10 Essential (primary) hypertension: Secondary | ICD-10-CM | POA: Diagnosis not present

## 2015-04-09 DIAGNOSIS — I10 Essential (primary) hypertension: Secondary | ICD-10-CM | POA: Diagnosis not present

## 2015-04-10 DIAGNOSIS — I10 Essential (primary) hypertension: Secondary | ICD-10-CM | POA: Diagnosis not present

## 2015-04-11 DIAGNOSIS — I10 Essential (primary) hypertension: Secondary | ICD-10-CM | POA: Diagnosis not present

## 2015-04-12 DIAGNOSIS — I10 Essential (primary) hypertension: Secondary | ICD-10-CM | POA: Diagnosis not present

## 2015-04-13 DIAGNOSIS — I10 Essential (primary) hypertension: Secondary | ICD-10-CM | POA: Diagnosis not present

## 2015-04-14 DIAGNOSIS — I10 Essential (primary) hypertension: Secondary | ICD-10-CM | POA: Diagnosis not present

## 2015-04-15 DIAGNOSIS — I10 Essential (primary) hypertension: Secondary | ICD-10-CM | POA: Diagnosis not present

## 2015-04-16 DIAGNOSIS — I10 Essential (primary) hypertension: Secondary | ICD-10-CM | POA: Diagnosis not present

## 2015-04-17 DIAGNOSIS — I10 Essential (primary) hypertension: Secondary | ICD-10-CM | POA: Diagnosis not present

## 2015-04-18 DIAGNOSIS — I10 Essential (primary) hypertension: Secondary | ICD-10-CM | POA: Diagnosis not present

## 2015-04-19 DIAGNOSIS — R32 Unspecified urinary incontinence: Secondary | ICD-10-CM | POA: Diagnosis not present

## 2015-04-19 DIAGNOSIS — I10 Essential (primary) hypertension: Secondary | ICD-10-CM | POA: Diagnosis not present

## 2015-04-20 DIAGNOSIS — I10 Essential (primary) hypertension: Secondary | ICD-10-CM | POA: Diagnosis not present

## 2015-04-21 DIAGNOSIS — I10 Essential (primary) hypertension: Secondary | ICD-10-CM | POA: Diagnosis not present

## 2015-04-22 DIAGNOSIS — I10 Essential (primary) hypertension: Secondary | ICD-10-CM | POA: Diagnosis not present

## 2015-04-23 DIAGNOSIS — I10 Essential (primary) hypertension: Secondary | ICD-10-CM | POA: Diagnosis not present

## 2015-04-24 DIAGNOSIS — I10 Essential (primary) hypertension: Secondary | ICD-10-CM | POA: Diagnosis not present

## 2015-04-25 DIAGNOSIS — I10 Essential (primary) hypertension: Secondary | ICD-10-CM | POA: Diagnosis not present

## 2015-04-26 DIAGNOSIS — I10 Essential (primary) hypertension: Secondary | ICD-10-CM | POA: Diagnosis not present

## 2015-04-27 DIAGNOSIS — I10 Essential (primary) hypertension: Secondary | ICD-10-CM | POA: Diagnosis not present

## 2015-04-28 DIAGNOSIS — I10 Essential (primary) hypertension: Secondary | ICD-10-CM | POA: Diagnosis not present

## 2015-04-29 DIAGNOSIS — I10 Essential (primary) hypertension: Secondary | ICD-10-CM | POA: Diagnosis not present

## 2015-04-30 DIAGNOSIS — I10 Essential (primary) hypertension: Secondary | ICD-10-CM | POA: Diagnosis not present

## 2015-05-01 DIAGNOSIS — I10 Essential (primary) hypertension: Secondary | ICD-10-CM | POA: Diagnosis not present

## 2015-05-02 DIAGNOSIS — I10 Essential (primary) hypertension: Secondary | ICD-10-CM | POA: Diagnosis not present

## 2015-05-03 DIAGNOSIS — I10 Essential (primary) hypertension: Secondary | ICD-10-CM | POA: Diagnosis not present

## 2015-05-04 DIAGNOSIS — I10 Essential (primary) hypertension: Secondary | ICD-10-CM | POA: Diagnosis not present

## 2015-05-05 DIAGNOSIS — I10 Essential (primary) hypertension: Secondary | ICD-10-CM | POA: Diagnosis not present

## 2015-05-06 DIAGNOSIS — I739 Peripheral vascular disease, unspecified: Secondary | ICD-10-CM | POA: Diagnosis not present

## 2015-05-06 DIAGNOSIS — I10 Essential (primary) hypertension: Secondary | ICD-10-CM | POA: Diagnosis not present

## 2015-05-06 DIAGNOSIS — B351 Tinea unguium: Secondary | ICD-10-CM | POA: Diagnosis not present

## 2015-05-06 DIAGNOSIS — L851 Acquired keratosis [keratoderma] palmaris et plantaris: Secondary | ICD-10-CM | POA: Diagnosis not present

## 2015-05-07 DIAGNOSIS — I10 Essential (primary) hypertension: Secondary | ICD-10-CM | POA: Diagnosis not present

## 2015-05-08 DIAGNOSIS — I10 Essential (primary) hypertension: Secondary | ICD-10-CM | POA: Diagnosis not present

## 2015-05-09 ENCOUNTER — Other Ambulatory Visit: Payer: Self-pay | Admitting: Family Medicine

## 2015-05-09 DIAGNOSIS — I10 Essential (primary) hypertension: Secondary | ICD-10-CM | POA: Diagnosis not present

## 2015-05-10 DIAGNOSIS — I10 Essential (primary) hypertension: Secondary | ICD-10-CM | POA: Diagnosis not present

## 2015-05-11 ENCOUNTER — Ambulatory Visit (INDEPENDENT_AMBULATORY_CARE_PROVIDER_SITE_OTHER): Payer: Commercial Managed Care - HMO | Admitting: Family Medicine

## 2015-05-11 ENCOUNTER — Encounter: Payer: Self-pay | Admitting: Family Medicine

## 2015-05-11 VITALS — BP 146/84 | HR 84 | Resp 18 | Ht 61.0 in | Wt 105.0 lb

## 2015-05-11 DIAGNOSIS — Z23 Encounter for immunization: Secondary | ICD-10-CM | POA: Diagnosis not present

## 2015-05-11 DIAGNOSIS — Z0000 Encounter for general adult medical examination without abnormal findings: Secondary | ICD-10-CM

## 2015-05-11 DIAGNOSIS — R7302 Impaired glucose tolerance (oral): Secondary | ICD-10-CM | POA: Diagnosis not present

## 2015-05-11 DIAGNOSIS — I10 Essential (primary) hypertension: Secondary | ICD-10-CM | POA: Diagnosis not present

## 2015-05-11 NOTE — Assessment & Plan Note (Signed)
Annual exam as documented. Counseling done  re healthy lifestyle involving increased food intake for weight gain, and continued safe ambulation and physical activity under supervision.The importance of adequate sleep also discussed.This is achieved with the help of medication Regular seat belt use and home safety, is also discussed.  Immunization and cancer screening needs are specifically addressed at this visit.

## 2015-05-11 NOTE — Progress Notes (Signed)
Subjective:    Patient ID: [name], female    DOB: 11/01/1906, 81 y.o.   MRN: [id number]  HPI Preventive Screening-Counseling & Management   Patient present here today for a Medicare annual wellness visit.   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 No  Over the past two weeks, have you felt little interest or pleasure in doing things? No  Have you lost interest or pleasure in daily life? No  Do you often feel hopeless? No  Do you cry easily over simple problems? No   Activities of Daily Living  In your present state of health, do you have any difficulty performing the following activities?  Driving?:  Yes,  Unable to drive Managing money?:  Yes, lives in family care home Feeding yourself?:No Getting from bed to chair?: Yes,  Needs limited assitance Climbing a flight of stairs?: Yes, due to age and mobility Preparing food and eating?: Yes, food prepared by caregivers Bathing or showering?: Yes, need assistance due to mobility Getting dressed?: Yes, due to use of assistive device and impaired mobility Getting to the toilet?: No  Using the toilet?:No Moving around from place to place?: Yes, due to decreased mobility  Fall Risk Assessment In the past year have you fallen or had a near fall?:No, walks with assistance Are you currently taking any medications that make you dizzys?:No   Hearing Difficulties: yes Do you often ask people to speak up or repeat themselves?: Yes Do you  experience ringing or noises in your ears?:No Do you have difficulty understanding soft or whispered voices?:yes  Cognitive Testing  Alert? Yes Normal Appearance?Yes  Oriented to person? Yes Place? At times no  Time? no  Displays appropriate judgment?no  Can read the correct time from a watch face? yes Are you having problems remembering things?yes  Advanced Directives have been discussed with the patient?Yes and brochure provided to caregiver   List the Names of Other Physician/Practitioners you currently use: Careteams updated    Indicate any recent Medical Services you may have received from other than [facility] providers in the past year (date may be approximate).   Assessment:    Annual Wellness Exam   Plan:    Medicare Attestation  I have personally reviewed:  The patient's medical and social history  Their use of alcohol, tobacco or illicit drugs  Their current medications and supplements  The patient's functional ability including ADLs,fall risks, home safety risks, cognitive, and hearing and visual impairment  Diet and physical activities  Evidence for depression or mood disorders  The patient's weight, height, BMI, and visual acuity have been recorded in the chart. I have made referrals, counseling, and provided education to the patient based on review of the above and I have provided the patient with a written personalized care plan for preventive services.      Review of Systems     Objective:   Physical Exam BP 146/84 mmHg  Pulse 84  Resp 18  Ht   (1.549 m)  Wt 105 lb (47.628 kg)  BMI 19.85 kg/m2  SpO2 97%        Assessment & Plan:  Medicare annual wellness visit, subsequent Annual exam as documented. Counseling done  re healthy lifestyle involving increased food intake for weight gain, and continued safe ambulation and physical activity under supervision.The importance of adequate sleep also discussed.This is achieved with the help of  medication Regular seat belt use and home safety, is also discussed.  Immunization and cancer screening needs are specifically addressed at this visit.   Need for vaccination with 13-polyvalent pneumococcal conjugate vaccine After obtaining informed consent, the vaccine is  administered by LPN.

## 2015-05-11 NOTE — Assessment & Plan Note (Signed)
After obtaining informed consent, the vaccine is  administered by LPN.  

## 2015-05-11 NOTE — Patient Instructions (Signed)
F/u in Novemebr, call if you need me before  Prevnar today  Fasting labs 1 week before follow up  No changes in medication  Thanks for choosing [facility], we consider it a privelige to serve you.

## 2015-05-12 DIAGNOSIS — I10 Essential (primary) hypertension: Secondary | ICD-10-CM | POA: Diagnosis not present

## 2015-05-13 DIAGNOSIS — I10 Essential (primary) hypertension: Secondary | ICD-10-CM | POA: Diagnosis not present

## 2015-05-14 DIAGNOSIS — I10 Essential (primary) hypertension: Secondary | ICD-10-CM | POA: Diagnosis not present

## 2015-05-15 DIAGNOSIS — I10 Essential (primary) hypertension: Secondary | ICD-10-CM | POA: Diagnosis not present

## 2015-05-16 DIAGNOSIS — I10 Essential (primary) hypertension: Secondary | ICD-10-CM | POA: Diagnosis not present

## 2015-05-17 DIAGNOSIS — I10 Essential (primary) hypertension: Secondary | ICD-10-CM | POA: Diagnosis not present

## 2015-05-18 DIAGNOSIS — I10 Essential (primary) hypertension: Secondary | ICD-10-CM | POA: Diagnosis not present

## 2015-05-19 DIAGNOSIS — I10 Essential (primary) hypertension: Secondary | ICD-10-CM | POA: Diagnosis not present

## 2015-05-20 DIAGNOSIS — I10 Essential (primary) hypertension: Secondary | ICD-10-CM | POA: Diagnosis not present

## 2015-05-21 DIAGNOSIS — I10 Essential (primary) hypertension: Secondary | ICD-10-CM | POA: Diagnosis not present

## 2015-05-22 DIAGNOSIS — I10 Essential (primary) hypertension: Secondary | ICD-10-CM | POA: Diagnosis not present

## 2015-05-23 DIAGNOSIS — I10 Essential (primary) hypertension: Secondary | ICD-10-CM | POA: Diagnosis not present

## 2015-05-24 DIAGNOSIS — I10 Essential (primary) hypertension: Secondary | ICD-10-CM | POA: Diagnosis not present

## 2015-05-25 DIAGNOSIS — I10 Essential (primary) hypertension: Secondary | ICD-10-CM | POA: Diagnosis not present

## 2015-05-26 DIAGNOSIS — I10 Essential (primary) hypertension: Secondary | ICD-10-CM | POA: Diagnosis not present

## 2015-05-27 DIAGNOSIS — I10 Essential (primary) hypertension: Secondary | ICD-10-CM | POA: Diagnosis not present

## 2015-05-28 DIAGNOSIS — I10 Essential (primary) hypertension: Secondary | ICD-10-CM | POA: Diagnosis not present

## 2015-05-29 DIAGNOSIS — I10 Essential (primary) hypertension: Secondary | ICD-10-CM | POA: Diagnosis not present

## 2015-05-30 DIAGNOSIS — I10 Essential (primary) hypertension: Secondary | ICD-10-CM | POA: Diagnosis not present

## 2015-05-31 DIAGNOSIS — I10 Essential (primary) hypertension: Secondary | ICD-10-CM | POA: Diagnosis not present

## 2015-06-01 DIAGNOSIS — I10 Essential (primary) hypertension: Secondary | ICD-10-CM | POA: Diagnosis not present

## 2015-06-02 DIAGNOSIS — I10 Essential (primary) hypertension: Secondary | ICD-10-CM | POA: Diagnosis not present

## 2015-06-03 DIAGNOSIS — I10 Essential (primary) hypertension: Secondary | ICD-10-CM | POA: Diagnosis not present

## 2015-06-04 DIAGNOSIS — I10 Essential (primary) hypertension: Secondary | ICD-10-CM | POA: Diagnosis not present

## 2015-06-05 DIAGNOSIS — I10 Essential (primary) hypertension: Secondary | ICD-10-CM | POA: Diagnosis not present

## 2015-06-06 DIAGNOSIS — I10 Essential (primary) hypertension: Secondary | ICD-10-CM | POA: Diagnosis not present

## 2015-06-07 DIAGNOSIS — I10 Essential (primary) hypertension: Secondary | ICD-10-CM | POA: Diagnosis not present

## 2015-06-08 DIAGNOSIS — I10 Essential (primary) hypertension: Secondary | ICD-10-CM | POA: Diagnosis not present

## 2015-06-09 DIAGNOSIS — I10 Essential (primary) hypertension: Secondary | ICD-10-CM | POA: Diagnosis not present

## 2015-06-10 DIAGNOSIS — I10 Essential (primary) hypertension: Secondary | ICD-10-CM | POA: Diagnosis not present

## 2015-06-11 DIAGNOSIS — I10 Essential (primary) hypertension: Secondary | ICD-10-CM | POA: Diagnosis not present

## 2015-06-12 DIAGNOSIS — I10 Essential (primary) hypertension: Secondary | ICD-10-CM | POA: Diagnosis not present

## 2015-06-13 DIAGNOSIS — I10 Essential (primary) hypertension: Secondary | ICD-10-CM | POA: Diagnosis not present

## 2015-06-14 DIAGNOSIS — I10 Essential (primary) hypertension: Secondary | ICD-10-CM | POA: Diagnosis not present

## 2015-06-15 DIAGNOSIS — I10 Essential (primary) hypertension: Secondary | ICD-10-CM | POA: Diagnosis not present

## 2015-06-16 DIAGNOSIS — I10 Essential (primary) hypertension: Secondary | ICD-10-CM | POA: Diagnosis not present

## 2015-06-17 DIAGNOSIS — I10 Essential (primary) hypertension: Secondary | ICD-10-CM | POA: Diagnosis not present

## 2015-06-18 DIAGNOSIS — I10 Essential (primary) hypertension: Secondary | ICD-10-CM | POA: Diagnosis not present

## 2015-06-19 DIAGNOSIS — I10 Essential (primary) hypertension: Secondary | ICD-10-CM | POA: Diagnosis not present

## 2015-06-20 DIAGNOSIS — I10 Essential (primary) hypertension: Secondary | ICD-10-CM | POA: Diagnosis not present

## 2015-06-21 DIAGNOSIS — I10 Essential (primary) hypertension: Secondary | ICD-10-CM | POA: Diagnosis not present

## 2015-06-22 DIAGNOSIS — I10 Essential (primary) hypertension: Secondary | ICD-10-CM | POA: Diagnosis not present

## 2015-06-23 DIAGNOSIS — I10 Essential (primary) hypertension: Secondary | ICD-10-CM | POA: Diagnosis not present

## 2015-06-24 DIAGNOSIS — I10 Essential (primary) hypertension: Secondary | ICD-10-CM | POA: Diagnosis not present

## 2015-06-25 DIAGNOSIS — I10 Essential (primary) hypertension: Secondary | ICD-10-CM | POA: Diagnosis not present

## 2015-06-26 DIAGNOSIS — I10 Essential (primary) hypertension: Secondary | ICD-10-CM | POA: Diagnosis not present

## 2015-06-27 DIAGNOSIS — I10 Essential (primary) hypertension: Secondary | ICD-10-CM | POA: Diagnosis not present

## 2015-06-28 DIAGNOSIS — I10 Essential (primary) hypertension: Secondary | ICD-10-CM | POA: Diagnosis not present

## 2015-06-29 DIAGNOSIS — I10 Essential (primary) hypertension: Secondary | ICD-10-CM | POA: Diagnosis not present

## 2015-06-30 DIAGNOSIS — I10 Essential (primary) hypertension: Secondary | ICD-10-CM | POA: Diagnosis not present

## 2015-07-01 DIAGNOSIS — I10 Essential (primary) hypertension: Secondary | ICD-10-CM | POA: Diagnosis not present

## 2015-07-02 DIAGNOSIS — I10 Essential (primary) hypertension: Secondary | ICD-10-CM | POA: Diagnosis not present

## 2015-07-03 DIAGNOSIS — I10 Essential (primary) hypertension: Secondary | ICD-10-CM | POA: Diagnosis not present

## 2015-07-04 DIAGNOSIS — I10 Essential (primary) hypertension: Secondary | ICD-10-CM | POA: Diagnosis not present

## 2015-07-05 DIAGNOSIS — I10 Essential (primary) hypertension: Secondary | ICD-10-CM | POA: Diagnosis not present

## 2015-07-06 DIAGNOSIS — I10 Essential (primary) hypertension: Secondary | ICD-10-CM | POA: Diagnosis not present

## 2015-07-07 DIAGNOSIS — I10 Essential (primary) hypertension: Secondary | ICD-10-CM | POA: Diagnosis not present

## 2015-07-08 DIAGNOSIS — I10 Essential (primary) hypertension: Secondary | ICD-10-CM | POA: Diagnosis not present

## 2015-07-09 DIAGNOSIS — I10 Essential (primary) hypertension: Secondary | ICD-10-CM | POA: Diagnosis not present

## 2015-07-10 DIAGNOSIS — I10 Essential (primary) hypertension: Secondary | ICD-10-CM | POA: Diagnosis not present

## 2015-07-11 DIAGNOSIS — I10 Essential (primary) hypertension: Secondary | ICD-10-CM | POA: Diagnosis not present

## 2015-07-12 DIAGNOSIS — I10 Essential (primary) hypertension: Secondary | ICD-10-CM | POA: Diagnosis not present

## 2015-07-13 DIAGNOSIS — I10 Essential (primary) hypertension: Secondary | ICD-10-CM | POA: Diagnosis not present

## 2015-07-14 DIAGNOSIS — I10 Essential (primary) hypertension: Secondary | ICD-10-CM | POA: Diagnosis not present

## 2015-07-15 DIAGNOSIS — I739 Peripheral vascular disease, unspecified: Secondary | ICD-10-CM | POA: Diagnosis not present

## 2015-07-15 DIAGNOSIS — L851 Acquired keratosis [keratoderma] palmaris et plantaris: Secondary | ICD-10-CM | POA: Diagnosis not present

## 2015-07-15 DIAGNOSIS — B351 Tinea unguium: Secondary | ICD-10-CM | POA: Diagnosis not present

## 2015-07-15 DIAGNOSIS — I10 Essential (primary) hypertension: Secondary | ICD-10-CM | POA: Diagnosis not present

## 2015-07-16 DIAGNOSIS — I10 Essential (primary) hypertension: Secondary | ICD-10-CM | POA: Diagnosis not present

## 2015-07-17 DIAGNOSIS — I10 Essential (primary) hypertension: Secondary | ICD-10-CM | POA: Diagnosis not present

## 2015-07-18 DIAGNOSIS — I10 Essential (primary) hypertension: Secondary | ICD-10-CM | POA: Diagnosis not present

## 2015-07-19 DIAGNOSIS — I10 Essential (primary) hypertension: Secondary | ICD-10-CM | POA: Diagnosis not present

## 2015-07-20 DIAGNOSIS — I10 Essential (primary) hypertension: Secondary | ICD-10-CM | POA: Diagnosis not present

## 2015-07-21 DIAGNOSIS — I10 Essential (primary) hypertension: Secondary | ICD-10-CM | POA: Diagnosis not present

## 2015-07-22 DIAGNOSIS — I10 Essential (primary) hypertension: Secondary | ICD-10-CM | POA: Diagnosis not present

## 2015-07-23 DIAGNOSIS — I10 Essential (primary) hypertension: Secondary | ICD-10-CM | POA: Diagnosis not present

## 2015-07-24 DIAGNOSIS — I10 Essential (primary) hypertension: Secondary | ICD-10-CM | POA: Diagnosis not present

## 2015-07-25 DIAGNOSIS — I10 Essential (primary) hypertension: Secondary | ICD-10-CM | POA: Diagnosis not present

## 2015-07-26 DIAGNOSIS — I10 Essential (primary) hypertension: Secondary | ICD-10-CM | POA: Diagnosis not present

## 2015-07-27 DIAGNOSIS — I10 Essential (primary) hypertension: Secondary | ICD-10-CM | POA: Diagnosis not present

## 2015-07-28 DIAGNOSIS — I10 Essential (primary) hypertension: Secondary | ICD-10-CM | POA: Diagnosis not present

## 2015-07-29 DIAGNOSIS — I10 Essential (primary) hypertension: Secondary | ICD-10-CM | POA: Diagnosis not present

## 2015-07-30 DIAGNOSIS — I10 Essential (primary) hypertension: Secondary | ICD-10-CM | POA: Diagnosis not present

## 2015-07-31 DIAGNOSIS — I10 Essential (primary) hypertension: Secondary | ICD-10-CM | POA: Diagnosis not present

## 2015-08-01 DIAGNOSIS — I10 Essential (primary) hypertension: Secondary | ICD-10-CM | POA: Diagnosis not present

## 2015-08-02 DIAGNOSIS — I10 Essential (primary) hypertension: Secondary | ICD-10-CM | POA: Diagnosis not present

## 2015-08-03 DIAGNOSIS — I10 Essential (primary) hypertension: Secondary | ICD-10-CM | POA: Diagnosis not present

## 2015-08-04 DIAGNOSIS — I10 Essential (primary) hypertension: Secondary | ICD-10-CM | POA: Diagnosis not present

## 2015-08-05 DIAGNOSIS — I10 Essential (primary) hypertension: Secondary | ICD-10-CM | POA: Diagnosis not present

## 2015-08-06 DIAGNOSIS — I10 Essential (primary) hypertension: Secondary | ICD-10-CM | POA: Diagnosis not present

## 2015-08-07 DIAGNOSIS — I10 Essential (primary) hypertension: Secondary | ICD-10-CM | POA: Diagnosis not present

## 2015-08-08 DIAGNOSIS — I10 Essential (primary) hypertension: Secondary | ICD-10-CM | POA: Diagnosis not present

## 2015-08-09 ENCOUNTER — Other Ambulatory Visit: Payer: Self-pay | Admitting: Family Medicine

## 2015-08-09 DIAGNOSIS — I10 Essential (primary) hypertension: Secondary | ICD-10-CM | POA: Diagnosis not present

## 2015-08-10 DIAGNOSIS — I10 Essential (primary) hypertension: Secondary | ICD-10-CM | POA: Diagnosis not present

## 2015-08-11 DIAGNOSIS — I10 Essential (primary) hypertension: Secondary | ICD-10-CM | POA: Diagnosis not present

## 2015-08-12 DIAGNOSIS — I10 Essential (primary) hypertension: Secondary | ICD-10-CM | POA: Diagnosis not present

## 2015-08-13 DIAGNOSIS — I10 Essential (primary) hypertension: Secondary | ICD-10-CM | POA: Diagnosis not present

## 2015-08-14 DIAGNOSIS — I10 Essential (primary) hypertension: Secondary | ICD-10-CM | POA: Diagnosis not present

## 2015-08-15 DIAGNOSIS — I10 Essential (primary) hypertension: Secondary | ICD-10-CM | POA: Diagnosis not present

## 2015-08-16 DIAGNOSIS — I10 Essential (primary) hypertension: Secondary | ICD-10-CM | POA: Diagnosis not present

## 2015-08-17 DIAGNOSIS — I10 Essential (primary) hypertension: Secondary | ICD-10-CM | POA: Diagnosis not present

## 2015-08-18 DIAGNOSIS — I10 Essential (primary) hypertension: Secondary | ICD-10-CM | POA: Diagnosis not present

## 2015-08-19 DIAGNOSIS — I10 Essential (primary) hypertension: Secondary | ICD-10-CM | POA: Diagnosis not present

## 2015-08-20 DIAGNOSIS — I10 Essential (primary) hypertension: Secondary | ICD-10-CM | POA: Diagnosis not present

## 2015-08-21 DIAGNOSIS — I10 Essential (primary) hypertension: Secondary | ICD-10-CM | POA: Diagnosis not present

## 2015-08-22 DIAGNOSIS — I10 Essential (primary) hypertension: Secondary | ICD-10-CM | POA: Diagnosis not present

## 2015-08-23 DIAGNOSIS — I10 Essential (primary) hypertension: Secondary | ICD-10-CM | POA: Diagnosis not present

## 2015-08-24 DIAGNOSIS — I10 Essential (primary) hypertension: Secondary | ICD-10-CM | POA: Diagnosis not present

## 2015-08-25 DIAGNOSIS — R32 Unspecified urinary incontinence: Secondary | ICD-10-CM | POA: Diagnosis not present

## 2015-08-25 DIAGNOSIS — I10 Essential (primary) hypertension: Secondary | ICD-10-CM | POA: Diagnosis not present

## 2015-08-26 DIAGNOSIS — I10 Essential (primary) hypertension: Secondary | ICD-10-CM | POA: Diagnosis not present

## 2015-08-27 DIAGNOSIS — I10 Essential (primary) hypertension: Secondary | ICD-10-CM | POA: Diagnosis not present

## 2015-08-28 DIAGNOSIS — I10 Essential (primary) hypertension: Secondary | ICD-10-CM | POA: Diagnosis not present

## 2015-08-29 DIAGNOSIS — I10 Essential (primary) hypertension: Secondary | ICD-10-CM | POA: Diagnosis not present

## 2015-08-30 DIAGNOSIS — I10 Essential (primary) hypertension: Secondary | ICD-10-CM | POA: Diagnosis not present

## 2015-08-31 DIAGNOSIS — I10 Essential (primary) hypertension: Secondary | ICD-10-CM | POA: Diagnosis not present

## 2015-09-01 DIAGNOSIS — I10 Essential (primary) hypertension: Secondary | ICD-10-CM | POA: Diagnosis not present

## 2015-09-02 DIAGNOSIS — I10 Essential (primary) hypertension: Secondary | ICD-10-CM | POA: Diagnosis not present

## 2015-09-03 DIAGNOSIS — I10 Essential (primary) hypertension: Secondary | ICD-10-CM | POA: Diagnosis not present

## 2015-09-04 DIAGNOSIS — I10 Essential (primary) hypertension: Secondary | ICD-10-CM | POA: Diagnosis not present

## 2015-09-05 DIAGNOSIS — I10 Essential (primary) hypertension: Secondary | ICD-10-CM | POA: Diagnosis not present

## 2015-09-06 DIAGNOSIS — I10 Essential (primary) hypertension: Secondary | ICD-10-CM | POA: Diagnosis not present

## 2015-09-07 DIAGNOSIS — I10 Essential (primary) hypertension: Secondary | ICD-10-CM | POA: Diagnosis not present

## 2015-09-08 DIAGNOSIS — I10 Essential (primary) hypertension: Secondary | ICD-10-CM | POA: Diagnosis not present

## 2015-09-09 DIAGNOSIS — I10 Essential (primary) hypertension: Secondary | ICD-10-CM | POA: Diagnosis not present

## 2015-09-10 DIAGNOSIS — I10 Essential (primary) hypertension: Secondary | ICD-10-CM | POA: Diagnosis not present

## 2015-09-11 DIAGNOSIS — I10 Essential (primary) hypertension: Secondary | ICD-10-CM | POA: Diagnosis not present

## 2015-09-12 DIAGNOSIS — I10 Essential (primary) hypertension: Secondary | ICD-10-CM | POA: Diagnosis not present

## 2015-09-13 DIAGNOSIS — I10 Essential (primary) hypertension: Secondary | ICD-10-CM | POA: Diagnosis not present

## 2015-09-14 DIAGNOSIS — I10 Essential (primary) hypertension: Secondary | ICD-10-CM | POA: Diagnosis not present

## 2015-09-15 DIAGNOSIS — I10 Essential (primary) hypertension: Secondary | ICD-10-CM | POA: Diagnosis not present

## 2015-09-16 DIAGNOSIS — I10 Essential (primary) hypertension: Secondary | ICD-10-CM | POA: Diagnosis not present

## 2015-09-17 DIAGNOSIS — I10 Essential (primary) hypertension: Secondary | ICD-10-CM | POA: Diagnosis not present

## 2015-09-18 DIAGNOSIS — I10 Essential (primary) hypertension: Secondary | ICD-10-CM | POA: Diagnosis not present

## 2015-09-19 DIAGNOSIS — I10 Essential (primary) hypertension: Secondary | ICD-10-CM | POA: Diagnosis not present

## 2015-09-20 DIAGNOSIS — I10 Essential (primary) hypertension: Secondary | ICD-10-CM | POA: Diagnosis not present

## 2015-09-21 DIAGNOSIS — I10 Essential (primary) hypertension: Secondary | ICD-10-CM | POA: Diagnosis not present

## 2015-09-22 ENCOUNTER — Encounter: Payer: Self-pay | Admitting: Family Medicine

## 2015-09-22 ENCOUNTER — Ambulatory Visit (INDEPENDENT_AMBULATORY_CARE_PROVIDER_SITE_OTHER): Payer: Commercial Managed Care - HMO | Admitting: Family Medicine

## 2015-09-22 VITALS — BP 140/78 | HR 84 | Resp 16 | Ht 61.0 in | Wt 103.0 lb

## 2015-09-22 DIAGNOSIS — Z23 Encounter for immunization: Secondary | ICD-10-CM

## 2015-09-22 DIAGNOSIS — I10 Essential (primary) hypertension: Secondary | ICD-10-CM

## 2015-09-22 DIAGNOSIS — F0390 Unspecified dementia without behavioral disturbance: Secondary | ICD-10-CM

## 2015-09-22 DIAGNOSIS — L989 Disorder of the skin and subcutaneous tissue, unspecified: Secondary | ICD-10-CM

## 2015-09-22 DIAGNOSIS — N39498 Other specified urinary incontinence: Secondary | ICD-10-CM | POA: Diagnosis not present

## 2015-09-22 DIAGNOSIS — M4727 Other spondylosis with radiculopathy, lumbosacral region: Secondary | ICD-10-CM

## 2015-09-22 DIAGNOSIS — K5730 Diverticulosis of large intestine without perforation or abscess without bleeding: Secondary | ICD-10-CM

## 2015-09-22 NOTE — Progress Notes (Signed)
   Subjective:    Patient ID: [name], female    DOB: 02/17/1906, [age] y.o.   MRN: [id number]  HPI   [name]     MRN: [id number]      DOB: 11/10/1906   HPI History provided by her caregiver, Jan FiremanLucy Jackson, as patient  is incapable due to severe dementia [name] is here for follow up and re-evaluation of chronic medical conditions, medication management and review of any available recent lab and radiology data.  Preventive health is updated, specifically   Immunization.     denies any adverse reactions to current medications since the last visit. Wants medication list adjusted down to what she is really taking , and that is only her antihypertensive, which is all she needs  There are no new concerns.  C/o dark area on right leg at times painful, has been there for months, no change in size  ROS Denies recent fever or chills. Denies sinus pressure, nasal congestion, ear pain or sore throat. Denies chest congestion, productive cough or wheezing. Denies chest pains, palpitations and leg swelling Denies abdominal pain, nausea, vomiting,diarrhea or constipation.   Denies malodoros urine , has chronic  incontinence.  Denies , seizures,   PE  BP 140/78 mmHg  Pulse 84  Resp 16  Ht 5\' 1"  (1.549 m)  Wt 103 lb (46.72 kg)  BMI 19.47 kg/m2  SpO2 100%  Patient alert disoriented and in no cardiopulmonary distress.  HEENT: No facial asymmetry, EOMI,   oropharynx pink and moist.  Neck decreased ROM no JVD, no mass.  Chest: Clear to auscultation bilaterally.  CVS: S1, S2 no murmurs, no S3.Regular rate.  ABD: Soft non tender.   Ext: No edema  MS: decreased ROM spine, shoulders, hips and knees.  Skin: Intact, hyperpigmented crusted lesion on right leg.  Psych: Good eye contact, . Memory loss CNS: CN 2-12 intact, power,  normal throughout.no focal deficits noted.   Assessment & Plan  Incontinence of urine Ongoing, needs fore incontinence  supplies  Need for prophylactic vaccination and inoculation against influenza After obtaining informed consent, the vaccine is  administered by LPN.   Osteoarthritis of spine Incapable of assistive device without assistive device. No falls, relies on wheelchair outside of her home environ  Dementia No behavioral issues, maintained on no medication, no wandering, sleeps well  Diverticulosis of large intestine Stable no recent bleed  Essential hypertension Controlled, no change in medication   Skin lesion of right leg Topical antibiotic twice daily to lesion on rigth lower extremity, product supplied from office       Review of Systems     Objective:   Physical Exam        Assessment & Plan:

## 2015-09-22 NOTE — Patient Instructions (Signed)
F/u in 5 month, call if t you need me sooner  Take blood pressure pill every day, you need this, stop the other 2 medications, xanax and mirtazapine  Flu vaccine today  Antibiotic ointment for use twice daily to sore spot on right leg for 3 days, medication provided at visit  Keep skin moisturized well, lotion and or baby oil daily to skin  [facility], we consider it a privelige to serve you.

## 2015-09-23 DIAGNOSIS — I10 Essential (primary) hypertension: Secondary | ICD-10-CM | POA: Diagnosis not present

## 2015-09-24 DIAGNOSIS — I10 Essential (primary) hypertension: Secondary | ICD-10-CM | POA: Diagnosis not present

## 2015-09-25 DIAGNOSIS — I10 Essential (primary) hypertension: Secondary | ICD-10-CM | POA: Diagnosis not present

## 2015-09-26 DIAGNOSIS — I10 Essential (primary) hypertension: Secondary | ICD-10-CM | POA: Diagnosis not present

## 2015-09-27 DIAGNOSIS — I10 Essential (primary) hypertension: Secondary | ICD-10-CM | POA: Diagnosis not present

## 2015-09-28 DIAGNOSIS — I10 Essential (primary) hypertension: Secondary | ICD-10-CM | POA: Diagnosis not present

## 2015-09-29 DIAGNOSIS — I10 Essential (primary) hypertension: Secondary | ICD-10-CM | POA: Diagnosis not present

## 2015-09-30 DIAGNOSIS — I10 Essential (primary) hypertension: Secondary | ICD-10-CM | POA: Diagnosis not present

## 2015-10-01 DIAGNOSIS — I10 Essential (primary) hypertension: Secondary | ICD-10-CM | POA: Diagnosis not present

## 2015-10-01 DIAGNOSIS — L989 Disorder of the skin and subcutaneous tissue, unspecified: Secondary | ICD-10-CM | POA: Insufficient documentation

## 2015-10-01 DIAGNOSIS — Z23 Encounter for immunization: Secondary | ICD-10-CM | POA: Insufficient documentation

## 2015-10-01 NOTE — Assessment & Plan Note (Signed)
No behavioral issues, maintained on no medication, no wandering, sleeps well

## 2015-10-01 NOTE — Assessment & Plan Note (Signed)
Stable no recent bleed

## 2015-10-01 NOTE — Assessment & Plan Note (Signed)
Topical antibiotic twice daily to lesion on rigth lower extremity, product supplied from office

## 2015-10-01 NOTE — Assessment & Plan Note (Signed)
Ongoing, needs fore incontinence supplies

## 2015-10-01 NOTE — Assessment & Plan Note (Signed)
After obtaining informed consent, the vaccine is  administered by LPN.  

## 2015-10-01 NOTE — Assessment & Plan Note (Signed)
Controlled, no change in medication  

## 2015-10-01 NOTE — Assessment & Plan Note (Signed)
Incapable of assistive device without assistive device. No falls, relies on wheelchair outside of her home environ

## 2015-10-02 DIAGNOSIS — I10 Essential (primary) hypertension: Secondary | ICD-10-CM | POA: Diagnosis not present

## 2015-10-03 DIAGNOSIS — I10 Essential (primary) hypertension: Secondary | ICD-10-CM | POA: Diagnosis not present

## 2015-10-04 DIAGNOSIS — I10 Essential (primary) hypertension: Secondary | ICD-10-CM | POA: Diagnosis not present

## 2015-10-05 DIAGNOSIS — I10 Essential (primary) hypertension: Secondary | ICD-10-CM | POA: Diagnosis not present

## 2015-10-06 DIAGNOSIS — I10 Essential (primary) hypertension: Secondary | ICD-10-CM | POA: Diagnosis not present

## 2015-10-07 DIAGNOSIS — I10 Essential (primary) hypertension: Secondary | ICD-10-CM | POA: Diagnosis not present

## 2015-10-08 DIAGNOSIS — I10 Essential (primary) hypertension: Secondary | ICD-10-CM | POA: Diagnosis not present

## 2015-10-09 DIAGNOSIS — I10 Essential (primary) hypertension: Secondary | ICD-10-CM | POA: Diagnosis not present

## 2015-10-10 DIAGNOSIS — I10 Essential (primary) hypertension: Secondary | ICD-10-CM | POA: Diagnosis not present

## 2015-10-11 DIAGNOSIS — I10 Essential (primary) hypertension: Secondary | ICD-10-CM | POA: Diagnosis not present

## 2015-10-12 DIAGNOSIS — I10 Essential (primary) hypertension: Secondary | ICD-10-CM | POA: Diagnosis not present

## 2015-10-12 DIAGNOSIS — R32 Unspecified urinary incontinence: Secondary | ICD-10-CM | POA: Diagnosis not present

## 2015-10-13 DIAGNOSIS — I10 Essential (primary) hypertension: Secondary | ICD-10-CM | POA: Diagnosis not present

## 2015-10-14 DIAGNOSIS — I10 Essential (primary) hypertension: Secondary | ICD-10-CM | POA: Diagnosis not present

## 2015-10-15 DIAGNOSIS — I10 Essential (primary) hypertension: Secondary | ICD-10-CM | POA: Diagnosis not present

## 2015-10-16 DIAGNOSIS — I10 Essential (primary) hypertension: Secondary | ICD-10-CM | POA: Diagnosis not present

## 2015-10-17 DIAGNOSIS — I10 Essential (primary) hypertension: Secondary | ICD-10-CM | POA: Diagnosis not present

## 2015-10-18 DIAGNOSIS — I10 Essential (primary) hypertension: Secondary | ICD-10-CM | POA: Diagnosis not present

## 2015-10-19 DIAGNOSIS — I10 Essential (primary) hypertension: Secondary | ICD-10-CM | POA: Diagnosis not present

## 2015-10-20 DIAGNOSIS — I10 Essential (primary) hypertension: Secondary | ICD-10-CM | POA: Diagnosis not present

## 2015-10-21 DIAGNOSIS — I10 Essential (primary) hypertension: Secondary | ICD-10-CM | POA: Diagnosis not present

## 2015-10-22 DIAGNOSIS — I10 Essential (primary) hypertension: Secondary | ICD-10-CM | POA: Diagnosis not present

## 2015-10-23 DIAGNOSIS — I10 Essential (primary) hypertension: Secondary | ICD-10-CM | POA: Diagnosis not present

## 2015-10-24 DIAGNOSIS — I10 Essential (primary) hypertension: Secondary | ICD-10-CM | POA: Diagnosis not present

## 2015-10-25 DIAGNOSIS — I10 Essential (primary) hypertension: Secondary | ICD-10-CM | POA: Diagnosis not present

## 2015-10-26 DIAGNOSIS — I10 Essential (primary) hypertension: Secondary | ICD-10-CM | POA: Diagnosis not present

## 2015-10-27 DIAGNOSIS — I10 Essential (primary) hypertension: Secondary | ICD-10-CM | POA: Diagnosis not present

## 2015-10-28 DIAGNOSIS — I10 Essential (primary) hypertension: Secondary | ICD-10-CM | POA: Diagnosis not present

## 2015-10-29 DIAGNOSIS — I10 Essential (primary) hypertension: Secondary | ICD-10-CM | POA: Diagnosis not present

## 2015-10-30 DIAGNOSIS — I10 Essential (primary) hypertension: Secondary | ICD-10-CM | POA: Diagnosis not present

## 2015-10-31 DIAGNOSIS — I10 Essential (primary) hypertension: Secondary | ICD-10-CM | POA: Diagnosis not present

## 2015-11-01 DIAGNOSIS — I10 Essential (primary) hypertension: Secondary | ICD-10-CM | POA: Diagnosis not present

## 2015-11-02 DIAGNOSIS — I10 Essential (primary) hypertension: Secondary | ICD-10-CM | POA: Diagnosis not present

## 2015-11-03 DIAGNOSIS — I10 Essential (primary) hypertension: Secondary | ICD-10-CM | POA: Diagnosis not present

## 2015-11-04 DIAGNOSIS — I10 Essential (primary) hypertension: Secondary | ICD-10-CM | POA: Diagnosis not present

## 2015-11-05 DIAGNOSIS — I10 Essential (primary) hypertension: Secondary | ICD-10-CM | POA: Diagnosis not present

## 2015-11-06 DIAGNOSIS — I10 Essential (primary) hypertension: Secondary | ICD-10-CM | POA: Diagnosis not present

## 2015-11-07 DIAGNOSIS — I10 Essential (primary) hypertension: Secondary | ICD-10-CM | POA: Diagnosis not present

## 2015-11-08 ENCOUNTER — Other Ambulatory Visit: Payer: Self-pay | Admitting: Family Medicine

## 2015-11-08 DIAGNOSIS — I10 Essential (primary) hypertension: Secondary | ICD-10-CM | POA: Diagnosis not present

## 2015-11-09 DIAGNOSIS — I10 Essential (primary) hypertension: Secondary | ICD-10-CM | POA: Diagnosis not present

## 2015-11-10 DIAGNOSIS — I10 Essential (primary) hypertension: Secondary | ICD-10-CM | POA: Diagnosis not present

## 2015-11-11 DIAGNOSIS — I10 Essential (primary) hypertension: Secondary | ICD-10-CM | POA: Diagnosis not present

## 2015-11-12 DIAGNOSIS — I10 Essential (primary) hypertension: Secondary | ICD-10-CM | POA: Diagnosis not present

## 2015-11-13 DIAGNOSIS — I10 Essential (primary) hypertension: Secondary | ICD-10-CM | POA: Diagnosis not present

## 2015-11-14 DIAGNOSIS — I10 Essential (primary) hypertension: Secondary | ICD-10-CM | POA: Diagnosis not present

## 2015-11-15 DIAGNOSIS — I10 Essential (primary) hypertension: Secondary | ICD-10-CM | POA: Diagnosis not present

## 2015-11-16 DIAGNOSIS — I10 Essential (primary) hypertension: Secondary | ICD-10-CM | POA: Diagnosis not present

## 2015-11-17 DIAGNOSIS — I10 Essential (primary) hypertension: Secondary | ICD-10-CM | POA: Diagnosis not present

## 2015-11-18 DIAGNOSIS — I10 Essential (primary) hypertension: Secondary | ICD-10-CM | POA: Diagnosis not present

## 2015-11-19 DIAGNOSIS — I10 Essential (primary) hypertension: Secondary | ICD-10-CM | POA: Diagnosis not present

## 2015-11-20 DIAGNOSIS — I10 Essential (primary) hypertension: Secondary | ICD-10-CM | POA: Diagnosis not present

## 2015-11-21 DIAGNOSIS — I10 Essential (primary) hypertension: Secondary | ICD-10-CM | POA: Diagnosis not present

## 2015-11-22 DIAGNOSIS — I10 Essential (primary) hypertension: Secondary | ICD-10-CM | POA: Diagnosis not present

## 2015-11-23 DIAGNOSIS — I10 Essential (primary) hypertension: Secondary | ICD-10-CM | POA: Diagnosis not present

## 2015-11-24 DIAGNOSIS — I10 Essential (primary) hypertension: Secondary | ICD-10-CM | POA: Diagnosis not present

## 2015-11-25 DIAGNOSIS — I10 Essential (primary) hypertension: Secondary | ICD-10-CM | POA: Diagnosis not present

## 2015-11-26 DIAGNOSIS — I10 Essential (primary) hypertension: Secondary | ICD-10-CM | POA: Diagnosis not present

## 2015-11-27 DIAGNOSIS — I10 Essential (primary) hypertension: Secondary | ICD-10-CM | POA: Diagnosis not present

## 2015-11-28 DIAGNOSIS — I10 Essential (primary) hypertension: Secondary | ICD-10-CM | POA: Diagnosis not present

## 2015-11-29 DIAGNOSIS — I10 Essential (primary) hypertension: Secondary | ICD-10-CM | POA: Diagnosis not present

## 2015-11-30 DIAGNOSIS — I10 Essential (primary) hypertension: Secondary | ICD-10-CM | POA: Diagnosis not present

## 2015-12-01 DIAGNOSIS — I10 Essential (primary) hypertension: Secondary | ICD-10-CM | POA: Diagnosis not present

## 2015-12-01 DIAGNOSIS — R32 Unspecified urinary incontinence: Secondary | ICD-10-CM | POA: Diagnosis not present

## 2015-12-02 DIAGNOSIS — I10 Essential (primary) hypertension: Secondary | ICD-10-CM | POA: Diagnosis not present

## 2015-12-03 DIAGNOSIS — I10 Essential (primary) hypertension: Secondary | ICD-10-CM | POA: Diagnosis not present

## 2015-12-04 DIAGNOSIS — I10 Essential (primary) hypertension: Secondary | ICD-10-CM | POA: Diagnosis not present

## 2015-12-05 DIAGNOSIS — I10 Essential (primary) hypertension: Secondary | ICD-10-CM | POA: Diagnosis not present

## 2015-12-06 DIAGNOSIS — I10 Essential (primary) hypertension: Secondary | ICD-10-CM | POA: Diagnosis not present

## 2015-12-07 DIAGNOSIS — I10 Essential (primary) hypertension: Secondary | ICD-10-CM | POA: Diagnosis not present

## 2015-12-08 DIAGNOSIS — I10 Essential (primary) hypertension: Secondary | ICD-10-CM | POA: Diagnosis not present

## 2015-12-09 DIAGNOSIS — I10 Essential (primary) hypertension: Secondary | ICD-10-CM | POA: Diagnosis not present

## 2015-12-10 DIAGNOSIS — I10 Essential (primary) hypertension: Secondary | ICD-10-CM | POA: Diagnosis not present

## 2015-12-11 DIAGNOSIS — I10 Essential (primary) hypertension: Secondary | ICD-10-CM | POA: Diagnosis not present

## 2015-12-12 DIAGNOSIS — I10 Essential (primary) hypertension: Secondary | ICD-10-CM | POA: Diagnosis not present

## 2015-12-13 DIAGNOSIS — I10 Essential (primary) hypertension: Secondary | ICD-10-CM | POA: Diagnosis not present

## 2015-12-16 DIAGNOSIS — L851 Acquired keratosis [keratoderma] palmaris et plantaris: Secondary | ICD-10-CM | POA: Diagnosis not present

## 2015-12-16 DIAGNOSIS — I739 Peripheral vascular disease, unspecified: Secondary | ICD-10-CM | POA: Diagnosis not present

## 2015-12-16 DIAGNOSIS — B351 Tinea unguium: Secondary | ICD-10-CM | POA: Diagnosis not present

## 2016-01-04 DIAGNOSIS — R32 Unspecified urinary incontinence: Secondary | ICD-10-CM | POA: Diagnosis not present

## 2016-01-11 DIAGNOSIS — I10 Essential (primary) hypertension: Secondary | ICD-10-CM | POA: Diagnosis not present

## 2016-01-12 DIAGNOSIS — I10 Essential (primary) hypertension: Secondary | ICD-10-CM | POA: Diagnosis not present

## 2016-01-13 DIAGNOSIS — I10 Essential (primary) hypertension: Secondary | ICD-10-CM | POA: Diagnosis not present

## 2016-01-14 DIAGNOSIS — I10 Essential (primary) hypertension: Secondary | ICD-10-CM | POA: Diagnosis not present

## 2016-01-15 DIAGNOSIS — I10 Essential (primary) hypertension: Secondary | ICD-10-CM | POA: Diagnosis not present

## 2016-01-16 DIAGNOSIS — I10 Essential (primary) hypertension: Secondary | ICD-10-CM | POA: Diagnosis not present

## 2016-01-17 DIAGNOSIS — I10 Essential (primary) hypertension: Secondary | ICD-10-CM | POA: Diagnosis not present

## 2016-01-18 DIAGNOSIS — R32 Unspecified urinary incontinence: Secondary | ICD-10-CM | POA: Diagnosis not present

## 2016-01-18 DIAGNOSIS — I10 Essential (primary) hypertension: Secondary | ICD-10-CM | POA: Diagnosis not present

## 2016-01-19 DIAGNOSIS — I10 Essential (primary) hypertension: Secondary | ICD-10-CM | POA: Diagnosis not present

## 2016-01-20 DIAGNOSIS — I10 Essential (primary) hypertension: Secondary | ICD-10-CM | POA: Diagnosis not present

## 2016-01-21 DIAGNOSIS — I10 Essential (primary) hypertension: Secondary | ICD-10-CM | POA: Diagnosis not present

## 2016-01-22 DIAGNOSIS — I10 Essential (primary) hypertension: Secondary | ICD-10-CM | POA: Diagnosis not present

## 2016-01-23 DIAGNOSIS — I10 Essential (primary) hypertension: Secondary | ICD-10-CM | POA: Diagnosis not present

## 2016-01-24 DIAGNOSIS — I10 Essential (primary) hypertension: Secondary | ICD-10-CM | POA: Diagnosis not present

## 2016-01-25 DIAGNOSIS — I10 Essential (primary) hypertension: Secondary | ICD-10-CM | POA: Diagnosis not present

## 2016-01-26 DIAGNOSIS — I10 Essential (primary) hypertension: Secondary | ICD-10-CM | POA: Diagnosis not present

## 2016-01-27 DIAGNOSIS — I10 Essential (primary) hypertension: Secondary | ICD-10-CM | POA: Diagnosis not present

## 2016-01-28 DIAGNOSIS — I10 Essential (primary) hypertension: Secondary | ICD-10-CM | POA: Diagnosis not present

## 2016-01-29 DIAGNOSIS — I10 Essential (primary) hypertension: Secondary | ICD-10-CM | POA: Diagnosis not present

## 2016-01-30 DIAGNOSIS — R32 Unspecified urinary incontinence: Secondary | ICD-10-CM | POA: Diagnosis not present

## 2016-01-30 DIAGNOSIS — I10 Essential (primary) hypertension: Secondary | ICD-10-CM | POA: Diagnosis not present

## 2016-01-31 DIAGNOSIS — I10 Essential (primary) hypertension: Secondary | ICD-10-CM | POA: Diagnosis not present

## 2016-02-01 DIAGNOSIS — I10 Essential (primary) hypertension: Secondary | ICD-10-CM | POA: Diagnosis not present

## 2016-02-02 DIAGNOSIS — I10 Essential (primary) hypertension: Secondary | ICD-10-CM | POA: Diagnosis not present

## 2016-02-03 DIAGNOSIS — I10 Essential (primary) hypertension: Secondary | ICD-10-CM | POA: Diagnosis not present

## 2016-02-04 DIAGNOSIS — I10 Essential (primary) hypertension: Secondary | ICD-10-CM | POA: Diagnosis not present

## 2016-02-05 DIAGNOSIS — I10 Essential (primary) hypertension: Secondary | ICD-10-CM | POA: Diagnosis not present

## 2016-02-06 DIAGNOSIS — I10 Essential (primary) hypertension: Secondary | ICD-10-CM | POA: Diagnosis not present

## 2016-02-07 DIAGNOSIS — I10 Essential (primary) hypertension: Secondary | ICD-10-CM | POA: Diagnosis not present

## 2016-02-08 DIAGNOSIS — I10 Essential (primary) hypertension: Secondary | ICD-10-CM | POA: Diagnosis not present

## 2016-02-09 DIAGNOSIS — I10 Essential (primary) hypertension: Secondary | ICD-10-CM | POA: Diagnosis not present

## 2016-02-10 DIAGNOSIS — I10 Essential (primary) hypertension: Secondary | ICD-10-CM | POA: Diagnosis not present

## 2016-02-11 DIAGNOSIS — I10 Essential (primary) hypertension: Secondary | ICD-10-CM | POA: Diagnosis not present

## 2016-02-12 DIAGNOSIS — I10 Essential (primary) hypertension: Secondary | ICD-10-CM | POA: Diagnosis not present

## 2016-02-13 DIAGNOSIS — I10 Essential (primary) hypertension: Secondary | ICD-10-CM | POA: Diagnosis not present

## 2016-02-14 DIAGNOSIS — I10 Essential (primary) hypertension: Secondary | ICD-10-CM | POA: Diagnosis not present

## 2016-02-15 DIAGNOSIS — I10 Essential (primary) hypertension: Secondary | ICD-10-CM | POA: Diagnosis not present

## 2016-02-16 DIAGNOSIS — I10 Essential (primary) hypertension: Secondary | ICD-10-CM | POA: Diagnosis not present

## 2016-02-17 DIAGNOSIS — I10 Essential (primary) hypertension: Secondary | ICD-10-CM | POA: Diagnosis not present

## 2016-02-18 DIAGNOSIS — I10 Essential (primary) hypertension: Secondary | ICD-10-CM | POA: Diagnosis not present

## 2016-02-19 DIAGNOSIS — I10 Essential (primary) hypertension: Secondary | ICD-10-CM | POA: Diagnosis not present

## 2016-02-20 ENCOUNTER — Ambulatory Visit (INDEPENDENT_AMBULATORY_CARE_PROVIDER_SITE_OTHER): Payer: Commercial Managed Care - HMO | Admitting: Family Medicine

## 2016-02-20 ENCOUNTER — Encounter: Payer: Self-pay | Admitting: Family Medicine

## 2016-02-20 VITALS — BP 126/64 | HR 86 | Resp 16 | Ht 60.0 in | Wt 100.0 lb

## 2016-02-20 DIAGNOSIS — N39498 Other specified urinary incontinence: Secondary | ICD-10-CM

## 2016-02-20 DIAGNOSIS — M47899 Other spondylosis, site unspecified: Secondary | ICD-10-CM

## 2016-02-20 DIAGNOSIS — L989 Disorder of the skin and subcutaneous tissue, unspecified: Secondary | ICD-10-CM | POA: Diagnosis not present

## 2016-02-20 DIAGNOSIS — I10 Essential (primary) hypertension: Secondary | ICD-10-CM | POA: Diagnosis not present

## 2016-02-20 NOTE — Assessment & Plan Note (Signed)
Continued need   of incontinence wear esecially due to limited mobility

## 2016-02-20 NOTE — Assessment & Plan Note (Signed)
Chronic right leg lesion, unchanged in appearance, topical antibiotic as needed for flare of redness or pain

## 2016-02-20 NOTE — Assessment & Plan Note (Signed)
Controlled , no med change °

## 2016-02-20 NOTE — Patient Instructions (Signed)
Annual wellness end August/ early September  No changes in medication.  Hope that you continue to do well

## 2016-02-20 NOTE — Progress Notes (Signed)
   Subjective:    Patient ID: [name], female    DOB: 12/06/1905, [age] y.o.   MRN: [id number]  HPI   [name]     MRN: [id number]      DOB: 01/17/1906   HPI [name] is here for follow up and re-evaluation of chronic medical conditions, medication management and review of any available recent lab and radiology data.  Preventive health is updated, specifically  Immunization.   The PT denies any adverse reactions to current medications since the last visit.  There are no new concerns.  There are no specific complaints  No rectal bleed , fall orr hospital or ED visit since last appt  ROS History from caregiver who she lives with, [name] Denies recent fever or chills.Reports good appetite and regular bowel movements Denies sinus pressure, nasal congestion,  Denies chest congestion, productive cough or wheezing. Denies PND, orthopnea and leg swelling Denies vomit, loose stool or constipation Denies malodorous urine , has incontinence C/o chronic joint stiffness and limitation in mobility, ambulates at home with cane, no falls or near falls Denies pressure ulcers or open skin lesions or rashes  PE  BP 126/64 mmHg  Pulse 86  Resp 16  Ht 5' (1.524 m)  Wt 100 lb (45.36 kg)  BMI 19.53 kg/m2  SpO2 99%  Patient alert and in no cardiopulmonary distress.  HEENT: No facial asymmetry, EOMI,   oropharynx pink and moist.  Neck decreased ROM no JVD, no mass.  Chest: Clear to auscultation bilaterally.  CVS: S1, S2 no murmurs, no S3.Regular rate.  ABD: Soft non tender.   Ext: No edema  MS: decreased ROM spine, hips , shoulders and knees  Skin: Intact, hyperpigmented lesion with erythematous surrounding size of a nickel on left leg, unchanged, no purulent drainage or excessive tenderness.  Psych: Good eye contact, normal affect. Memory impaired  CNS: CN 2-12 intact, power,  normal throughout.no focal deficits noted.   Assessment &  Plan  Essential hypertension Controlled , no med change  Incontinence of urine Continued need   of incontinence wear esecially due to limited mobility   Skin lesion of right leg Chronic right leg lesion, unchanged in appearance, topical antibiotic as needed for flare of redness or pain  Osteoarthritis of spine Progressing with age, continue use of wheelchair outside of the home and cane inside for safe mobility       Review of Systems     Objective:   Physical Exam        Assessment & Plan:

## 2016-02-20 NOTE — Assessment & Plan Note (Signed)
Progressing with age, continue use of wheelchair outside of the home and cane inside for safe mobility

## 2016-02-21 DIAGNOSIS — I10 Essential (primary) hypertension: Secondary | ICD-10-CM | POA: Diagnosis not present

## 2016-02-22 DIAGNOSIS — I10 Essential (primary) hypertension: Secondary | ICD-10-CM | POA: Diagnosis not present

## 2016-02-23 DIAGNOSIS — I10 Essential (primary) hypertension: Secondary | ICD-10-CM | POA: Diagnosis not present

## 2016-02-24 DIAGNOSIS — I10 Essential (primary) hypertension: Secondary | ICD-10-CM | POA: Diagnosis not present

## 2016-02-25 DIAGNOSIS — I10 Essential (primary) hypertension: Secondary | ICD-10-CM | POA: Diagnosis not present

## 2016-02-26 DIAGNOSIS — I10 Essential (primary) hypertension: Secondary | ICD-10-CM | POA: Diagnosis not present

## 2016-02-27 DIAGNOSIS — I10 Essential (primary) hypertension: Secondary | ICD-10-CM | POA: Diagnosis not present

## 2016-02-28 DIAGNOSIS — R32 Unspecified urinary incontinence: Secondary | ICD-10-CM | POA: Diagnosis not present

## 2016-02-28 DIAGNOSIS — I10 Essential (primary) hypertension: Secondary | ICD-10-CM | POA: Diagnosis not present

## 2016-02-29 DIAGNOSIS — I10 Essential (primary) hypertension: Secondary | ICD-10-CM | POA: Diagnosis not present

## 2016-03-01 DIAGNOSIS — I10 Essential (primary) hypertension: Secondary | ICD-10-CM | POA: Diagnosis not present

## 2016-03-02 DIAGNOSIS — I10 Essential (primary) hypertension: Secondary | ICD-10-CM | POA: Diagnosis not present

## 2016-03-03 DIAGNOSIS — I10 Essential (primary) hypertension: Secondary | ICD-10-CM | POA: Diagnosis not present

## 2016-03-04 DIAGNOSIS — I10 Essential (primary) hypertension: Secondary | ICD-10-CM | POA: Diagnosis not present

## 2016-03-05 DIAGNOSIS — I10 Essential (primary) hypertension: Secondary | ICD-10-CM | POA: Diagnosis not present

## 2016-03-06 DIAGNOSIS — I10 Essential (primary) hypertension: Secondary | ICD-10-CM | POA: Diagnosis not present

## 2016-03-07 DIAGNOSIS — I10 Essential (primary) hypertension: Secondary | ICD-10-CM | POA: Diagnosis not present

## 2016-03-08 DIAGNOSIS — I10 Essential (primary) hypertension: Secondary | ICD-10-CM | POA: Diagnosis not present

## 2016-03-09 ENCOUNTER — Other Ambulatory Visit: Payer: Self-pay | Admitting: Family Medicine

## 2016-03-09 DIAGNOSIS — I10 Essential (primary) hypertension: Secondary | ICD-10-CM | POA: Diagnosis not present

## 2016-03-10 DIAGNOSIS — I10 Essential (primary) hypertension: Secondary | ICD-10-CM | POA: Diagnosis not present

## 2016-03-11 DIAGNOSIS — I10 Essential (primary) hypertension: Secondary | ICD-10-CM | POA: Diagnosis not present

## 2016-03-12 DIAGNOSIS — I10 Essential (primary) hypertension: Secondary | ICD-10-CM | POA: Diagnosis not present

## 2016-03-13 DIAGNOSIS — I10 Essential (primary) hypertension: Secondary | ICD-10-CM | POA: Diagnosis not present

## 2016-03-14 DIAGNOSIS — I10 Essential (primary) hypertension: Secondary | ICD-10-CM | POA: Diagnosis not present

## 2016-03-15 DIAGNOSIS — I10 Essential (primary) hypertension: Secondary | ICD-10-CM | POA: Diagnosis not present

## 2016-03-16 DIAGNOSIS — I10 Essential (primary) hypertension: Secondary | ICD-10-CM | POA: Diagnosis not present

## 2016-03-17 DIAGNOSIS — I10 Essential (primary) hypertension: Secondary | ICD-10-CM | POA: Diagnosis not present

## 2016-03-18 DIAGNOSIS — I10 Essential (primary) hypertension: Secondary | ICD-10-CM | POA: Diagnosis not present

## 2016-03-19 DIAGNOSIS — I10 Essential (primary) hypertension: Secondary | ICD-10-CM | POA: Diagnosis not present

## 2016-03-20 DIAGNOSIS — I10 Essential (primary) hypertension: Secondary | ICD-10-CM | POA: Diagnosis not present

## 2016-03-21 DIAGNOSIS — I10 Essential (primary) hypertension: Secondary | ICD-10-CM | POA: Diagnosis not present

## 2016-03-22 DIAGNOSIS — I10 Essential (primary) hypertension: Secondary | ICD-10-CM | POA: Diagnosis not present

## 2016-03-23 DIAGNOSIS — I10 Essential (primary) hypertension: Secondary | ICD-10-CM | POA: Diagnosis not present

## 2016-03-24 DIAGNOSIS — I10 Essential (primary) hypertension: Secondary | ICD-10-CM | POA: Diagnosis not present

## 2016-03-25 DIAGNOSIS — I10 Essential (primary) hypertension: Secondary | ICD-10-CM | POA: Diagnosis not present

## 2016-03-26 DIAGNOSIS — I10 Essential (primary) hypertension: Secondary | ICD-10-CM | POA: Diagnosis not present

## 2016-03-27 DIAGNOSIS — I10 Essential (primary) hypertension: Secondary | ICD-10-CM | POA: Diagnosis not present

## 2016-03-28 DIAGNOSIS — I10 Essential (primary) hypertension: Secondary | ICD-10-CM | POA: Diagnosis not present

## 2016-03-29 DIAGNOSIS — I10 Essential (primary) hypertension: Secondary | ICD-10-CM | POA: Diagnosis not present

## 2016-03-30 DIAGNOSIS — I10 Essential (primary) hypertension: Secondary | ICD-10-CM | POA: Diagnosis not present

## 2016-03-31 DIAGNOSIS — I10 Essential (primary) hypertension: Secondary | ICD-10-CM | POA: Diagnosis not present

## 2016-04-01 DIAGNOSIS — I10 Essential (primary) hypertension: Secondary | ICD-10-CM | POA: Diagnosis not present

## 2016-04-02 DIAGNOSIS — I10 Essential (primary) hypertension: Secondary | ICD-10-CM | POA: Diagnosis not present

## 2016-04-03 DIAGNOSIS — I10 Essential (primary) hypertension: Secondary | ICD-10-CM | POA: Diagnosis not present

## 2016-04-04 DIAGNOSIS — R32 Unspecified urinary incontinence: Secondary | ICD-10-CM | POA: Diagnosis not present

## 2016-04-04 DIAGNOSIS — I10 Essential (primary) hypertension: Secondary | ICD-10-CM | POA: Diagnosis not present

## 2016-04-05 DIAGNOSIS — I10 Essential (primary) hypertension: Secondary | ICD-10-CM | POA: Diagnosis not present

## 2016-04-06 DIAGNOSIS — I10 Essential (primary) hypertension: Secondary | ICD-10-CM | POA: Diagnosis not present

## 2016-04-07 DIAGNOSIS — I10 Essential (primary) hypertension: Secondary | ICD-10-CM | POA: Diagnosis not present

## 2016-04-08 DIAGNOSIS — I10 Essential (primary) hypertension: Secondary | ICD-10-CM | POA: Diagnosis not present

## 2016-04-09 DIAGNOSIS — I10 Essential (primary) hypertension: Secondary | ICD-10-CM | POA: Diagnosis not present

## 2016-04-10 DIAGNOSIS — I10 Essential (primary) hypertension: Secondary | ICD-10-CM | POA: Diagnosis not present

## 2016-04-11 DIAGNOSIS — I10 Essential (primary) hypertension: Secondary | ICD-10-CM | POA: Diagnosis not present

## 2016-04-12 DIAGNOSIS — I10 Essential (primary) hypertension: Secondary | ICD-10-CM | POA: Diagnosis not present

## 2016-04-13 DIAGNOSIS — I10 Essential (primary) hypertension: Secondary | ICD-10-CM | POA: Diagnosis not present

## 2016-04-14 DIAGNOSIS — I10 Essential (primary) hypertension: Secondary | ICD-10-CM | POA: Diagnosis not present

## 2016-04-15 DIAGNOSIS — I10 Essential (primary) hypertension: Secondary | ICD-10-CM | POA: Diagnosis not present

## 2016-04-16 DIAGNOSIS — I10 Essential (primary) hypertension: Secondary | ICD-10-CM | POA: Diagnosis not present

## 2016-04-17 DIAGNOSIS — I10 Essential (primary) hypertension: Secondary | ICD-10-CM | POA: Diagnosis not present

## 2016-04-18 DIAGNOSIS — I10 Essential (primary) hypertension: Secondary | ICD-10-CM | POA: Diagnosis not present

## 2016-04-19 DIAGNOSIS — I10 Essential (primary) hypertension: Secondary | ICD-10-CM | POA: Diagnosis not present

## 2016-04-20 DIAGNOSIS — I10 Essential (primary) hypertension: Secondary | ICD-10-CM | POA: Diagnosis not present

## 2016-04-21 DIAGNOSIS — I10 Essential (primary) hypertension: Secondary | ICD-10-CM | POA: Diagnosis not present

## 2016-04-22 DIAGNOSIS — I10 Essential (primary) hypertension: Secondary | ICD-10-CM | POA: Diagnosis not present

## 2016-04-23 DIAGNOSIS — I10 Essential (primary) hypertension: Secondary | ICD-10-CM | POA: Diagnosis not present

## 2016-04-24 DIAGNOSIS — I10 Essential (primary) hypertension: Secondary | ICD-10-CM | POA: Diagnosis not present

## 2016-04-25 DIAGNOSIS — I10 Essential (primary) hypertension: Secondary | ICD-10-CM | POA: Diagnosis not present

## 2016-04-26 DIAGNOSIS — I10 Essential (primary) hypertension: Secondary | ICD-10-CM | POA: Diagnosis not present

## 2016-04-27 DIAGNOSIS — I10 Essential (primary) hypertension: Secondary | ICD-10-CM | POA: Diagnosis not present

## 2016-04-28 DIAGNOSIS — I10 Essential (primary) hypertension: Secondary | ICD-10-CM | POA: Diagnosis not present

## 2016-04-29 DIAGNOSIS — I10 Essential (primary) hypertension: Secondary | ICD-10-CM | POA: Diagnosis not present

## 2016-04-30 DIAGNOSIS — I10 Essential (primary) hypertension: Secondary | ICD-10-CM | POA: Diagnosis not present

## 2016-05-01 DIAGNOSIS — I10 Essential (primary) hypertension: Secondary | ICD-10-CM | POA: Diagnosis not present

## 2016-05-02 DIAGNOSIS — I10 Essential (primary) hypertension: Secondary | ICD-10-CM | POA: Diagnosis not present

## 2016-05-03 DIAGNOSIS — I10 Essential (primary) hypertension: Secondary | ICD-10-CM | POA: Diagnosis not present

## 2016-05-04 DIAGNOSIS — I10 Essential (primary) hypertension: Secondary | ICD-10-CM | POA: Diagnosis not present

## 2016-05-05 DIAGNOSIS — I10 Essential (primary) hypertension: Secondary | ICD-10-CM | POA: Diagnosis not present

## 2016-05-06 DIAGNOSIS — I10 Essential (primary) hypertension: Secondary | ICD-10-CM | POA: Diagnosis not present

## 2016-05-07 DIAGNOSIS — I10 Essential (primary) hypertension: Secondary | ICD-10-CM | POA: Diagnosis not present

## 2016-05-08 DIAGNOSIS — I10 Essential (primary) hypertension: Secondary | ICD-10-CM | POA: Diagnosis not present

## 2016-05-09 DIAGNOSIS — I10 Essential (primary) hypertension: Secondary | ICD-10-CM | POA: Diagnosis not present

## 2016-05-10 DIAGNOSIS — I10 Essential (primary) hypertension: Secondary | ICD-10-CM | POA: Diagnosis not present

## 2016-05-11 DIAGNOSIS — I10 Essential (primary) hypertension: Secondary | ICD-10-CM | POA: Diagnosis not present

## 2016-05-12 DIAGNOSIS — I10 Essential (primary) hypertension: Secondary | ICD-10-CM | POA: Diagnosis not present

## 2016-05-13 DIAGNOSIS — I10 Essential (primary) hypertension: Secondary | ICD-10-CM | POA: Diagnosis not present

## 2016-05-14 DIAGNOSIS — I10 Essential (primary) hypertension: Secondary | ICD-10-CM | POA: Diagnosis not present

## 2016-05-15 DIAGNOSIS — I10 Essential (primary) hypertension: Secondary | ICD-10-CM | POA: Diagnosis not present

## 2016-05-16 DIAGNOSIS — I10 Essential (primary) hypertension: Secondary | ICD-10-CM | POA: Diagnosis not present

## 2016-05-17 DIAGNOSIS — I10 Essential (primary) hypertension: Secondary | ICD-10-CM | POA: Diagnosis not present

## 2016-05-18 DIAGNOSIS — I10 Essential (primary) hypertension: Secondary | ICD-10-CM | POA: Diagnosis not present

## 2016-05-19 DIAGNOSIS — I10 Essential (primary) hypertension: Secondary | ICD-10-CM | POA: Diagnosis not present

## 2016-05-20 DIAGNOSIS — I10 Essential (primary) hypertension: Secondary | ICD-10-CM | POA: Diagnosis not present

## 2016-05-21 DIAGNOSIS — I10 Essential (primary) hypertension: Secondary | ICD-10-CM | POA: Diagnosis not present

## 2016-05-22 DIAGNOSIS — I10 Essential (primary) hypertension: Secondary | ICD-10-CM | POA: Diagnosis not present

## 2016-05-23 DIAGNOSIS — I10 Essential (primary) hypertension: Secondary | ICD-10-CM | POA: Diagnosis not present

## 2016-05-24 DIAGNOSIS — I10 Essential (primary) hypertension: Secondary | ICD-10-CM | POA: Diagnosis not present

## 2016-05-25 DIAGNOSIS — I10 Essential (primary) hypertension: Secondary | ICD-10-CM | POA: Diagnosis not present

## 2016-05-26 DIAGNOSIS — I10 Essential (primary) hypertension: Secondary | ICD-10-CM | POA: Diagnosis not present

## 2016-05-27 DIAGNOSIS — I10 Essential (primary) hypertension: Secondary | ICD-10-CM | POA: Diagnosis not present

## 2016-05-28 DIAGNOSIS — I10 Essential (primary) hypertension: Secondary | ICD-10-CM | POA: Diagnosis not present

## 2016-05-29 DIAGNOSIS — I10 Essential (primary) hypertension: Secondary | ICD-10-CM | POA: Diagnosis not present

## 2016-05-30 DIAGNOSIS — I10 Essential (primary) hypertension: Secondary | ICD-10-CM | POA: Diagnosis not present

## 2016-05-31 DIAGNOSIS — I10 Essential (primary) hypertension: Secondary | ICD-10-CM | POA: Diagnosis not present

## 2016-06-01 DIAGNOSIS — I10 Essential (primary) hypertension: Secondary | ICD-10-CM | POA: Diagnosis not present

## 2016-06-02 DIAGNOSIS — I10 Essential (primary) hypertension: Secondary | ICD-10-CM | POA: Diagnosis not present

## 2016-06-03 DIAGNOSIS — I10 Essential (primary) hypertension: Secondary | ICD-10-CM | POA: Diagnosis not present

## 2016-06-04 DIAGNOSIS — I10 Essential (primary) hypertension: Secondary | ICD-10-CM | POA: Diagnosis not present

## 2016-06-05 DIAGNOSIS — I10 Essential (primary) hypertension: Secondary | ICD-10-CM | POA: Diagnosis not present

## 2016-06-06 DIAGNOSIS — I10 Essential (primary) hypertension: Secondary | ICD-10-CM | POA: Diagnosis not present

## 2016-06-07 DIAGNOSIS — I10 Essential (primary) hypertension: Secondary | ICD-10-CM | POA: Diagnosis not present

## 2016-06-08 DIAGNOSIS — I10 Essential (primary) hypertension: Secondary | ICD-10-CM | POA: Diagnosis not present

## 2016-06-09 DIAGNOSIS — I10 Essential (primary) hypertension: Secondary | ICD-10-CM | POA: Diagnosis not present

## 2016-06-10 DIAGNOSIS — I10 Essential (primary) hypertension: Secondary | ICD-10-CM | POA: Diagnosis not present

## 2016-06-11 DIAGNOSIS — I10 Essential (primary) hypertension: Secondary | ICD-10-CM | POA: Diagnosis not present

## 2016-06-12 DIAGNOSIS — I10 Essential (primary) hypertension: Secondary | ICD-10-CM | POA: Diagnosis not present

## 2016-06-13 DIAGNOSIS — I10 Essential (primary) hypertension: Secondary | ICD-10-CM | POA: Diagnosis not present

## 2016-06-14 DIAGNOSIS — I10 Essential (primary) hypertension: Secondary | ICD-10-CM | POA: Diagnosis not present

## 2016-06-15 DIAGNOSIS — I10 Essential (primary) hypertension: Secondary | ICD-10-CM | POA: Diagnosis not present

## 2016-06-16 DIAGNOSIS — I10 Essential (primary) hypertension: Secondary | ICD-10-CM | POA: Diagnosis not present

## 2016-06-17 DIAGNOSIS — I10 Essential (primary) hypertension: Secondary | ICD-10-CM | POA: Diagnosis not present

## 2016-06-18 DIAGNOSIS — I10 Essential (primary) hypertension: Secondary | ICD-10-CM | POA: Diagnosis not present

## 2016-06-19 DIAGNOSIS — I10 Essential (primary) hypertension: Secondary | ICD-10-CM | POA: Diagnosis not present

## 2016-06-20 DIAGNOSIS — I10 Essential (primary) hypertension: Secondary | ICD-10-CM | POA: Diagnosis not present

## 2016-06-21 DIAGNOSIS — I10 Essential (primary) hypertension: Secondary | ICD-10-CM | POA: Diagnosis not present

## 2016-06-22 DIAGNOSIS — I10 Essential (primary) hypertension: Secondary | ICD-10-CM | POA: Diagnosis not present

## 2016-06-22 DIAGNOSIS — R32 Unspecified urinary incontinence: Secondary | ICD-10-CM | POA: Diagnosis not present

## 2016-06-23 DIAGNOSIS — I10 Essential (primary) hypertension: Secondary | ICD-10-CM | POA: Diagnosis not present

## 2016-06-24 DIAGNOSIS — I10 Essential (primary) hypertension: Secondary | ICD-10-CM | POA: Diagnosis not present

## 2016-06-25 DIAGNOSIS — I10 Essential (primary) hypertension: Secondary | ICD-10-CM | POA: Diagnosis not present

## 2016-06-26 DIAGNOSIS — I10 Essential (primary) hypertension: Secondary | ICD-10-CM | POA: Diagnosis not present

## 2016-06-27 DIAGNOSIS — I10 Essential (primary) hypertension: Secondary | ICD-10-CM | POA: Diagnosis not present

## 2016-06-28 DIAGNOSIS — I10 Essential (primary) hypertension: Secondary | ICD-10-CM | POA: Diagnosis not present

## 2016-06-29 DIAGNOSIS — I10 Essential (primary) hypertension: Secondary | ICD-10-CM | POA: Diagnosis not present

## 2016-06-30 DIAGNOSIS — I10 Essential (primary) hypertension: Secondary | ICD-10-CM | POA: Diagnosis not present

## 2016-07-01 DIAGNOSIS — I10 Essential (primary) hypertension: Secondary | ICD-10-CM | POA: Diagnosis not present

## 2016-07-02 DIAGNOSIS — I10 Essential (primary) hypertension: Secondary | ICD-10-CM | POA: Diagnosis not present

## 2016-07-03 DIAGNOSIS — I10 Essential (primary) hypertension: Secondary | ICD-10-CM | POA: Diagnosis not present

## 2016-07-04 DIAGNOSIS — I10 Essential (primary) hypertension: Secondary | ICD-10-CM | POA: Diagnosis not present

## 2016-07-05 DIAGNOSIS — I10 Essential (primary) hypertension: Secondary | ICD-10-CM | POA: Diagnosis not present

## 2016-07-06 DIAGNOSIS — I10 Essential (primary) hypertension: Secondary | ICD-10-CM | POA: Diagnosis not present

## 2016-07-07 DIAGNOSIS — I10 Essential (primary) hypertension: Secondary | ICD-10-CM | POA: Diagnosis not present

## 2016-07-08 DIAGNOSIS — I10 Essential (primary) hypertension: Secondary | ICD-10-CM | POA: Diagnosis not present

## 2016-07-09 ENCOUNTER — Encounter: Payer: Self-pay | Admitting: Family Medicine

## 2016-07-09 DIAGNOSIS — I10 Essential (primary) hypertension: Secondary | ICD-10-CM | POA: Diagnosis not present

## 2016-07-10 DIAGNOSIS — I10 Essential (primary) hypertension: Secondary | ICD-10-CM | POA: Diagnosis not present

## 2016-07-11 DIAGNOSIS — I10 Essential (primary) hypertension: Secondary | ICD-10-CM | POA: Diagnosis not present

## 2016-07-12 DIAGNOSIS — I10 Essential (primary) hypertension: Secondary | ICD-10-CM | POA: Diagnosis not present

## 2016-07-13 DIAGNOSIS — I10 Essential (primary) hypertension: Secondary | ICD-10-CM | POA: Diagnosis not present

## 2016-07-14 DIAGNOSIS — I10 Essential (primary) hypertension: Secondary | ICD-10-CM | POA: Diagnosis not present

## 2016-07-15 DIAGNOSIS — I10 Essential (primary) hypertension: Secondary | ICD-10-CM | POA: Diagnosis not present

## 2016-07-16 DIAGNOSIS — I10 Essential (primary) hypertension: Secondary | ICD-10-CM | POA: Diagnosis not present

## 2016-07-17 DIAGNOSIS — I10 Essential (primary) hypertension: Secondary | ICD-10-CM | POA: Diagnosis not present

## 2016-07-18 DIAGNOSIS — I10 Essential (primary) hypertension: Secondary | ICD-10-CM | POA: Diagnosis not present

## 2016-07-19 DIAGNOSIS — I10 Essential (primary) hypertension: Secondary | ICD-10-CM | POA: Diagnosis not present

## 2016-07-20 DIAGNOSIS — I10 Essential (primary) hypertension: Secondary | ICD-10-CM | POA: Diagnosis not present

## 2016-07-21 DIAGNOSIS — I10 Essential (primary) hypertension: Secondary | ICD-10-CM | POA: Diagnosis not present

## 2016-07-22 DIAGNOSIS — I10 Essential (primary) hypertension: Secondary | ICD-10-CM | POA: Diagnosis not present

## 2016-07-23 DIAGNOSIS — I10 Essential (primary) hypertension: Secondary | ICD-10-CM | POA: Diagnosis not present

## 2016-07-24 DIAGNOSIS — I10 Essential (primary) hypertension: Secondary | ICD-10-CM | POA: Diagnosis not present

## 2016-07-25 DIAGNOSIS — I10 Essential (primary) hypertension: Secondary | ICD-10-CM | POA: Diagnosis not present

## 2016-07-25 DIAGNOSIS — R32 Unspecified urinary incontinence: Secondary | ICD-10-CM | POA: Diagnosis not present

## 2016-07-26 DIAGNOSIS — I10 Essential (primary) hypertension: Secondary | ICD-10-CM | POA: Diagnosis not present

## 2016-07-27 DIAGNOSIS — I10 Essential (primary) hypertension: Secondary | ICD-10-CM | POA: Diagnosis not present

## 2016-07-28 DIAGNOSIS — I10 Essential (primary) hypertension: Secondary | ICD-10-CM | POA: Diagnosis not present

## 2016-07-29 DIAGNOSIS — I10 Essential (primary) hypertension: Secondary | ICD-10-CM | POA: Diagnosis not present

## 2016-07-30 DIAGNOSIS — I10 Essential (primary) hypertension: Secondary | ICD-10-CM | POA: Diagnosis not present

## 2016-07-31 DIAGNOSIS — I10 Essential (primary) hypertension: Secondary | ICD-10-CM | POA: Diagnosis not present

## 2016-08-01 DIAGNOSIS — I10 Essential (primary) hypertension: Secondary | ICD-10-CM | POA: Diagnosis not present

## 2016-08-02 DIAGNOSIS — I10 Essential (primary) hypertension: Secondary | ICD-10-CM | POA: Diagnosis not present

## 2016-08-03 DIAGNOSIS — I10 Essential (primary) hypertension: Secondary | ICD-10-CM | POA: Diagnosis not present

## 2016-08-04 DIAGNOSIS — I10 Essential (primary) hypertension: Secondary | ICD-10-CM | POA: Diagnosis not present

## 2016-08-05 DIAGNOSIS — I10 Essential (primary) hypertension: Secondary | ICD-10-CM | POA: Diagnosis not present

## 2016-08-06 DIAGNOSIS — I10 Essential (primary) hypertension: Secondary | ICD-10-CM | POA: Diagnosis not present

## 2016-08-07 ENCOUNTER — Ambulatory Visit (INDEPENDENT_AMBULATORY_CARE_PROVIDER_SITE_OTHER): Payer: Commercial Managed Care - HMO

## 2016-08-07 VITALS — BP 138/70 | HR 66 | Resp 18 | Wt 98.1 lb

## 2016-08-07 DIAGNOSIS — Z0000 Encounter for general adult medical examination without abnormal findings: Secondary | ICD-10-CM | POA: Diagnosis not present

## 2016-08-07 DIAGNOSIS — I10 Essential (primary) hypertension: Secondary | ICD-10-CM | POA: Diagnosis not present

## 2016-08-07 NOTE — Progress Notes (Signed)
Subjective:    [name] is a 82 y.o. female who presents for Medicare Annual/Subsequent preventive examination.  Preventive Screening-Counseling & Management  Tobacco History  Smoking Status  . Never Smoker  Smokeless Tobacco  . Not on file    [facility] a [facility].  She does not have any immediate family and has end stage dementia.   Current Problems (verified) Patient Active Problem List   Diagnosis Date Noted  . Skin lesion of right leg 10/01/2015  . Unspecified vitamin D deficiency 03/12/2014  . Callus of foot 03/12/2014  . Fatigue 07/14/2012  . Incontinence of urine 03/02/2012  . ONYCHOMYCOSIS, BILATERAL 05/09/2009  . Dementia 03/10/2008  . Essential hypertension 03/10/2008  . Diverticulosis of large intestine 03/10/2008  . Osteoarthritis of spine 03/10/2008    Medications Prior to Visit Current Outpatient Prescriptions on File Prior to Visit  Medication Sig Dispense Refill  . amLODipine-benazepril (LOTREL) 10-20 MG capsule TAKE 1 CAPSULE BY MOUTH ONCE DAILY 30 capsule 4   No current facility-administered medications on file prior to visit.     Current Medications (verified) Current Outpatient Prescriptions  Medication Sig Dispense Refill  . amLODipine-benazepril (LOTREL) 10-20 MG capsule TAKE 1 CAPSULE BY MOUTH ONCE DAILY 30 capsule 4   No current facility-administered medications for this visit.      Allergies (verified) Aspirin   PAST HISTORY  Family History Family History  Problem Relation Age of Onset  . Family history unknown: Yes    Social History Social History  Substance Use Topics  . Smoking status: Never Smoker  . Smokeless tobacco: Not on file  . Alcohol use No     Are there smokers in your home (other than you)? No  Risk Factors Current exercise habits: The patient does not participate in regular exercise at present.  Dietary issues discussed: Patient's diet is balanced due to being in  a family care home; discussed eating when she really doesn't want to    Cardiac risk factors: advanced age (older than 27 for men, 54 for women) and hypertension.  Depression Screen (Note: if answer to either of the following is "Yes", a more complete depression screening is indicated)   Over the past two weeks, have you felt down, depressed or hopeless? No  Over the past two weeks, have you felt little interest or pleasure in doing things? No  Have you lost interest or pleasure in daily life? No  Do you often feel hopeless? No  Do you cry easily over simple problems? No  Activities of Daily Living In your present state of health, do you have any difficulty performing the following activities?:  Driving? Yes, transported  Managing money?  Yes due to advanced dementia  Feeding yourself? No Getting from bed to chair? YesNo exam performed today, annual wellness visit without physical exam. Climbing a flight of stairs? Yes Preparing food and eating?: Unable to cook but no problems with eating on her own  Bathing or showering? Yes assistance needed by family home staff   Getting dressed: Yes assistance required  Getting to the toilet? Yes assistance required  Using the toilet:No Moving around from place to place: Yes use of assistive device (cane) and assistance from family care home staff  In the past year have you fallen or had a near fall?:No   Are you sexually active?  No  Do you have more than one partner?  N/A  Hearing Difficulties: Yes Do you often ask  people to speak up or repeat themselves? Yes Do you experience ringing or noises in your ears? No Do you have difficulty understanding soft or whispered voices? Yes   Do you feel that you have a problem with memory? Yes  Do you often misplace items? Yes  Do you feel safe at home?  Yes  Cognitive Testing  Alert? Yes  Normal Appearance?Yes  Oriented to person? Yes  Place? Yes   Time? No  Recall of three objects?  No  Can  perform simple calculations? No  Displays appropriate judgment?Yes at times   Can read the correct time from a watch face?Yes   Advanced Directives have been discussed with the patient? No  List the Names of Other Physician/Practitioners you currently use: 1.  No additional   Indicate any recent Medical Services you may have received from other than [facility] providers in the past year (date may be approximate).  Immunization History  Administered Date(s) Administered  . Influenza,inj,Quad PF,36+ Mos 10/05/2013, 12/07/2014, 09/22/2015  . PPD Test 02/27/2012, 05/13/2012  . Pneumococcal Conjugate-13 05/11/2015  . Pneumococcal Polysaccharide-23 10/05/2013  . Tdap 03/20/2011    Screening Tests Health Maintenance  Topic Date Due  . INFLUENZA VACCINE  01/09/2017 (Originally 06/12/2016)  . ZOSTAVAX  01/22/2017 (Originally 09/18/1968)  . DEXA SCAN  01/23/2017 (Originally 09/18/1973)  . TETANUS/TDAP  03/19/2021  . PNA vac Low Risk Adult  Completed    All answers were reviewed with the patient and necessary referrals were made:  Durwin NoraSlade, Xandrea Clarey Blackwell, LPN   [id number]/86/57849/26/2017   History reviewed: allergies, current medications, past family history, past medical history, past social history, past surgical history and problem list  Review of Systems A comprehensive review of systems was negative.    Objective:     Vision by Snellen chart: right ONG:EXBMWUeye:unable to measure, left XLK:GMWNUUeye:unable to measure  Body mass index is 19.16 kg/m. BP 138/70   Pulse 66   Resp 18   Wt 98 lb 1.9 oz (44.5 kg)   SpO2 99%   BMI 19.16 kg/m   No exam performed today, annual wellness without physical exam.     Assessment:      Plan:     During the course of the visit the patient was educated and counseled about appropriate screening and preventive services including:    Influenza vaccine (patient will consider having flu shot at next visit)   Diet review for nutrition referral? Yes ____  Not Indicated  __x__   Patient Instructions (the written plan) was given to the patient.  Medicare Attestation I have personally reviewed: The patient's medical and social history Their use of alcohol, tobacco or illicit drugs Their current medications and supplements The patient's functional ability including ADLs,fall risks, home safety risks, cognitive, and hearing and visual impairment Diet and physical activities Evidence for depression or mood disorders  The patient's weight, height, BMI, and visual acuity have been recorded in the chart.  I have made referrals, counseling, and provided education to the patient based on review of the above and I have provided the patient with a written personalized care plan for preventive services.     Kandis FantasiaSlade, Shenica Holzheimer CopanBlackwell, [place]   7/25/36649/26/2017

## 2016-08-07 NOTE — Patient Instructions (Signed)
Thank you for choosing [facility] for your health care needs  The Annual Wellness Visit is designed to allow us the chance to assist you in preserving and improving you health.   Dr. Lodema HongSimpson will see you in 4 months  Your lab order will be mailed to you  If you have any concerns please don't hesitate to call.  The new # is [phone]

## 2016-08-08 DIAGNOSIS — I10 Essential (primary) hypertension: Secondary | ICD-10-CM | POA: Diagnosis not present

## 2016-08-09 DIAGNOSIS — I10 Essential (primary) hypertension: Secondary | ICD-10-CM | POA: Diagnosis not present

## 2016-08-10 DIAGNOSIS — I10 Essential (primary) hypertension: Secondary | ICD-10-CM | POA: Diagnosis not present

## 2016-08-11 DIAGNOSIS — I10 Essential (primary) hypertension: Secondary | ICD-10-CM | POA: Diagnosis not present

## 2016-08-12 DIAGNOSIS — I10 Essential (primary) hypertension: Secondary | ICD-10-CM | POA: Diagnosis not present

## 2016-08-13 DIAGNOSIS — I10 Essential (primary) hypertension: Secondary | ICD-10-CM | POA: Diagnosis not present

## 2016-08-14 DIAGNOSIS — I10 Essential (primary) hypertension: Secondary | ICD-10-CM | POA: Diagnosis not present

## 2016-08-15 DIAGNOSIS — I10 Essential (primary) hypertension: Secondary | ICD-10-CM | POA: Diagnosis not present

## 2016-08-16 DIAGNOSIS — I10 Essential (primary) hypertension: Secondary | ICD-10-CM | POA: Diagnosis not present

## 2016-08-17 DIAGNOSIS — I10 Essential (primary) hypertension: Secondary | ICD-10-CM | POA: Diagnosis not present

## 2016-08-18 DIAGNOSIS — I10 Essential (primary) hypertension: Secondary | ICD-10-CM | POA: Diagnosis not present

## 2016-08-19 DIAGNOSIS — I10 Essential (primary) hypertension: Secondary | ICD-10-CM | POA: Diagnosis not present

## 2016-08-20 DIAGNOSIS — I10 Essential (primary) hypertension: Secondary | ICD-10-CM | POA: Diagnosis not present

## 2016-08-21 DIAGNOSIS — I10 Essential (primary) hypertension: Secondary | ICD-10-CM | POA: Diagnosis not present

## 2016-08-22 DIAGNOSIS — I10 Essential (primary) hypertension: Secondary | ICD-10-CM | POA: Diagnosis not present

## 2016-08-23 DIAGNOSIS — I10 Essential (primary) hypertension: Secondary | ICD-10-CM | POA: Diagnosis not present

## 2016-08-24 DIAGNOSIS — I10 Essential (primary) hypertension: Secondary | ICD-10-CM | POA: Diagnosis not present

## 2016-08-25 DIAGNOSIS — I10 Essential (primary) hypertension: Secondary | ICD-10-CM | POA: Diagnosis not present

## 2016-08-26 DIAGNOSIS — I10 Essential (primary) hypertension: Secondary | ICD-10-CM | POA: Diagnosis not present

## 2016-08-27 DIAGNOSIS — I10 Essential (primary) hypertension: Secondary | ICD-10-CM | POA: Diagnosis not present

## 2016-08-28 DIAGNOSIS — I10 Essential (primary) hypertension: Secondary | ICD-10-CM | POA: Diagnosis not present

## 2016-08-29 DIAGNOSIS — I10 Essential (primary) hypertension: Secondary | ICD-10-CM | POA: Diagnosis not present

## 2016-08-30 DIAGNOSIS — I10 Essential (primary) hypertension: Secondary | ICD-10-CM | POA: Diagnosis not present

## 2016-08-31 DIAGNOSIS — I10 Essential (primary) hypertension: Secondary | ICD-10-CM | POA: Diagnosis not present

## 2016-09-01 DIAGNOSIS — I10 Essential (primary) hypertension: Secondary | ICD-10-CM | POA: Diagnosis not present

## 2016-09-02 DIAGNOSIS — I10 Essential (primary) hypertension: Secondary | ICD-10-CM | POA: Diagnosis not present

## 2016-09-03 DIAGNOSIS — I10 Essential (primary) hypertension: Secondary | ICD-10-CM | POA: Diagnosis not present

## 2016-09-04 DIAGNOSIS — I10 Essential (primary) hypertension: Secondary | ICD-10-CM | POA: Diagnosis not present

## 2016-09-05 DIAGNOSIS — I10 Essential (primary) hypertension: Secondary | ICD-10-CM | POA: Diagnosis not present

## 2016-09-06 DIAGNOSIS — I10 Essential (primary) hypertension: Secondary | ICD-10-CM | POA: Diagnosis not present

## 2016-09-07 DIAGNOSIS — I10 Essential (primary) hypertension: Secondary | ICD-10-CM | POA: Diagnosis not present

## 2016-09-08 DIAGNOSIS — I10 Essential (primary) hypertension: Secondary | ICD-10-CM | POA: Diagnosis not present

## 2016-09-09 DIAGNOSIS — I10 Essential (primary) hypertension: Secondary | ICD-10-CM | POA: Diagnosis not present

## 2016-09-10 DIAGNOSIS — I10 Essential (primary) hypertension: Secondary | ICD-10-CM | POA: Diagnosis not present

## 2016-09-11 DIAGNOSIS — I10 Essential (primary) hypertension: Secondary | ICD-10-CM | POA: Diagnosis not present

## 2016-09-12 DIAGNOSIS — I10 Essential (primary) hypertension: Secondary | ICD-10-CM | POA: Diagnosis not present

## 2016-09-13 DIAGNOSIS — I10 Essential (primary) hypertension: Secondary | ICD-10-CM | POA: Diagnosis not present

## 2016-09-14 DIAGNOSIS — I10 Essential (primary) hypertension: Secondary | ICD-10-CM | POA: Diagnosis not present

## 2016-09-15 DIAGNOSIS — I10 Essential (primary) hypertension: Secondary | ICD-10-CM | POA: Diagnosis not present

## 2016-09-16 DIAGNOSIS — I10 Essential (primary) hypertension: Secondary | ICD-10-CM | POA: Diagnosis not present

## 2016-09-17 DIAGNOSIS — I10 Essential (primary) hypertension: Secondary | ICD-10-CM | POA: Diagnosis not present

## 2016-09-18 DIAGNOSIS — I10 Essential (primary) hypertension: Secondary | ICD-10-CM | POA: Diagnosis not present

## 2016-09-19 DIAGNOSIS — I10 Essential (primary) hypertension: Secondary | ICD-10-CM | POA: Diagnosis not present

## 2016-09-20 DIAGNOSIS — I10 Essential (primary) hypertension: Secondary | ICD-10-CM | POA: Diagnosis not present

## 2016-09-21 DIAGNOSIS — I10 Essential (primary) hypertension: Secondary | ICD-10-CM | POA: Diagnosis not present

## 2016-09-21 DIAGNOSIS — R32 Unspecified urinary incontinence: Secondary | ICD-10-CM | POA: Diagnosis not present

## 2016-09-22 DIAGNOSIS — I10 Essential (primary) hypertension: Secondary | ICD-10-CM | POA: Diagnosis not present

## 2016-09-23 DIAGNOSIS — I10 Essential (primary) hypertension: Secondary | ICD-10-CM | POA: Diagnosis not present

## 2016-09-24 DIAGNOSIS — I10 Essential (primary) hypertension: Secondary | ICD-10-CM | POA: Diagnosis not present

## 2016-09-25 DIAGNOSIS — I10 Essential (primary) hypertension: Secondary | ICD-10-CM | POA: Diagnosis not present

## 2016-09-26 DIAGNOSIS — I10 Essential (primary) hypertension: Secondary | ICD-10-CM | POA: Diagnosis not present

## 2016-09-27 DIAGNOSIS — I10 Essential (primary) hypertension: Secondary | ICD-10-CM | POA: Diagnosis not present

## 2016-09-28 DIAGNOSIS — I10 Essential (primary) hypertension: Secondary | ICD-10-CM | POA: Diagnosis not present

## 2016-09-29 DIAGNOSIS — I10 Essential (primary) hypertension: Secondary | ICD-10-CM | POA: Diagnosis not present

## 2016-09-30 DIAGNOSIS — I10 Essential (primary) hypertension: Secondary | ICD-10-CM | POA: Diagnosis not present

## 2016-10-01 DIAGNOSIS — I10 Essential (primary) hypertension: Secondary | ICD-10-CM | POA: Diagnosis not present

## 2016-10-02 DIAGNOSIS — I10 Essential (primary) hypertension: Secondary | ICD-10-CM | POA: Diagnosis not present

## 2016-10-03 DIAGNOSIS — I10 Essential (primary) hypertension: Secondary | ICD-10-CM | POA: Diagnosis not present

## 2016-10-04 DIAGNOSIS — I10 Essential (primary) hypertension: Secondary | ICD-10-CM | POA: Diagnosis not present

## 2016-10-05 DIAGNOSIS — I10 Essential (primary) hypertension: Secondary | ICD-10-CM | POA: Diagnosis not present

## 2016-10-06 DIAGNOSIS — I10 Essential (primary) hypertension: Secondary | ICD-10-CM | POA: Diagnosis not present

## 2016-10-07 DIAGNOSIS — I10 Essential (primary) hypertension: Secondary | ICD-10-CM | POA: Diagnosis not present

## 2016-10-08 DIAGNOSIS — I10 Essential (primary) hypertension: Secondary | ICD-10-CM | POA: Diagnosis not present

## 2016-10-09 DIAGNOSIS — I10 Essential (primary) hypertension: Secondary | ICD-10-CM | POA: Diagnosis not present

## 2016-10-10 DIAGNOSIS — I10 Essential (primary) hypertension: Secondary | ICD-10-CM | POA: Diagnosis not present

## 2016-10-11 DIAGNOSIS — I10 Essential (primary) hypertension: Secondary | ICD-10-CM | POA: Diagnosis not present

## 2016-10-23 DIAGNOSIS — R32 Unspecified urinary incontinence: Secondary | ICD-10-CM | POA: Diagnosis not present

## 2016-12-12 ENCOUNTER — Encounter: Payer: Self-pay | Admitting: Family Medicine

## 2016-12-12 ENCOUNTER — Ambulatory Visit (INDEPENDENT_AMBULATORY_CARE_PROVIDER_SITE_OTHER): Payer: Medicare Other | Admitting: Family Medicine

## 2016-12-12 VITALS — BP 130/70 | HR 96 | Resp 16 | Ht 60.0 in | Wt 101.0 lb

## 2016-12-12 DIAGNOSIS — Z2821 Immunization not carried out because of patient refusal: Secondary | ICD-10-CM

## 2016-12-12 DIAGNOSIS — I10 Essential (primary) hypertension: Secondary | ICD-10-CM | POA: Diagnosis not present

## 2016-12-12 DIAGNOSIS — M47899 Other spondylosis, site unspecified: Secondary | ICD-10-CM | POA: Diagnosis not present

## 2016-12-12 DIAGNOSIS — Z789 Other specified health status: Secondary | ICD-10-CM

## 2016-12-12 NOTE — Progress Notes (Signed)
   [name]     MRN: [id number]      DOB: 03/28/1906   HPI Ms. [name] is here for follow up and re-evaluation of chronic medical conditions, medication management Preventive health is updated, specifically  d Immunization.  Patient refuses flu vaccine .  There are no new concerns.  Facility staff denies any falls, states she intentionally slides out of her chair at times with no injury Report is that her appetite is good, and that she has had no problem with constipation or rectal bleeding ROS See HPI History id from caregiver as patient is incapable  Chronic limitation in mobility, uses a cane to ambulate. Denies skin break down or rash.   PE  BP 130/70   Pulse 96   Resp 16   Ht 5' (1.524 m)   Wt 101 lb (45.8 kg)   SpO2 98%   BMI 19.73 kg/m   Patient alert and oriented and in no cardiopulmonary distress.  HEENT: No facial asymmetry, EOMI,   oropharynx pink and moist.  Neck decreased ROM no JVD, no mass.  Chest: Clear to auscultation bilaterally.  CVS: S1, S2 no murmurs, no S3.Regular rate.  ABD: Soft non tender.   Ext: No edema  MS: decreased  ROM spine, shoulders, hips and knees.  Skin: Intact, no ulcerations or rash noted.     Assessment & Plan  Essential hypertension Controlled, no change in medication  Osteoarthritis of spine Safety in facility reviewed with caregiver as well as regular use of assistive device  Senile dementia Advanced senior with dementia. Supportive management with [facility] care where she has been for at least 8 years.   Refused H1N1 influenza virus immunization Encouraged pt to take the flu vaccine and she refused

## 2016-12-12 NOTE — Assessment & Plan Note (Signed)
Safety in facility reviewed with caregiver as well as regular use of assistive device

## 2016-12-12 NOTE — Assessment & Plan Note (Signed)
Encouraged pt to take the flu vaccine and she refused

## 2016-12-12 NOTE — Assessment & Plan Note (Signed)
Advanced senior with dementia. Supportive management with [facility] home care where she has been for at least 8 years.

## 2016-12-12 NOTE — Patient Instructions (Signed)
Annual wellness Sept 30 or after, call if you need me sooner  Continue once daily blood pressure pill   Please be careful not to fall     Need flu vaccine, come in when you decide   Thank you  for choosing [facility]. We consider it a privelige to serve you.  Delivering excellent health care in a caring and  compassionate way is our goal.  Partnering with you,  so that together we can achieve this goal is our strategy.

## 2016-12-12 NOTE — Assessment & Plan Note (Signed)
Controlled, no change in medication  

## 2016-12-13 DIAGNOSIS — I10 Essential (primary) hypertension: Secondary | ICD-10-CM | POA: Diagnosis not present

## 2016-12-14 DIAGNOSIS — I10 Essential (primary) hypertension: Secondary | ICD-10-CM | POA: Diagnosis not present

## 2016-12-15 DIAGNOSIS — I10 Essential (primary) hypertension: Secondary | ICD-10-CM | POA: Diagnosis not present

## 2016-12-16 DIAGNOSIS — I10 Essential (primary) hypertension: Secondary | ICD-10-CM | POA: Diagnosis not present

## 2016-12-17 DIAGNOSIS — I10 Essential (primary) hypertension: Secondary | ICD-10-CM | POA: Diagnosis not present

## 2016-12-18 DIAGNOSIS — I10 Essential (primary) hypertension: Secondary | ICD-10-CM | POA: Diagnosis not present

## 2016-12-19 DIAGNOSIS — I10 Essential (primary) hypertension: Secondary | ICD-10-CM | POA: Diagnosis not present

## 2016-12-20 DIAGNOSIS — I10 Essential (primary) hypertension: Secondary | ICD-10-CM | POA: Diagnosis not present

## 2016-12-21 DIAGNOSIS — I10 Essential (primary) hypertension: Secondary | ICD-10-CM | POA: Diagnosis not present

## 2016-12-22 DIAGNOSIS — I10 Essential (primary) hypertension: Secondary | ICD-10-CM | POA: Diagnosis not present

## 2016-12-23 DIAGNOSIS — I10 Essential (primary) hypertension: Secondary | ICD-10-CM | POA: Diagnosis not present

## 2016-12-24 DIAGNOSIS — I10 Essential (primary) hypertension: Secondary | ICD-10-CM | POA: Diagnosis not present

## 2016-12-24 DIAGNOSIS — R32 Unspecified urinary incontinence: Secondary | ICD-10-CM | POA: Diagnosis not present

## 2016-12-25 DIAGNOSIS — I10 Essential (primary) hypertension: Secondary | ICD-10-CM | POA: Diagnosis not present

## 2016-12-26 DIAGNOSIS — I10 Essential (primary) hypertension: Secondary | ICD-10-CM | POA: Diagnosis not present

## 2016-12-27 DIAGNOSIS — I10 Essential (primary) hypertension: Secondary | ICD-10-CM | POA: Diagnosis not present

## 2016-12-28 DIAGNOSIS — I10 Essential (primary) hypertension: Secondary | ICD-10-CM | POA: Diagnosis not present

## 2016-12-29 DIAGNOSIS — I10 Essential (primary) hypertension: Secondary | ICD-10-CM | POA: Diagnosis not present

## 2016-12-30 DIAGNOSIS — I10 Essential (primary) hypertension: Secondary | ICD-10-CM | POA: Diagnosis not present

## 2016-12-31 DIAGNOSIS — I10 Essential (primary) hypertension: Secondary | ICD-10-CM | POA: Diagnosis not present

## 2017-01-01 DIAGNOSIS — I10 Essential (primary) hypertension: Secondary | ICD-10-CM | POA: Diagnosis not present

## 2017-01-02 DIAGNOSIS — I10 Essential (primary) hypertension: Secondary | ICD-10-CM | POA: Diagnosis not present

## 2017-01-03 DIAGNOSIS — I10 Essential (primary) hypertension: Secondary | ICD-10-CM | POA: Diagnosis not present

## 2017-01-04 DIAGNOSIS — I10 Essential (primary) hypertension: Secondary | ICD-10-CM | POA: Diagnosis not present

## 2017-01-05 DIAGNOSIS — I10 Essential (primary) hypertension: Secondary | ICD-10-CM | POA: Diagnosis not present

## 2017-01-06 DIAGNOSIS — I10 Essential (primary) hypertension: Secondary | ICD-10-CM | POA: Diagnosis not present

## 2017-01-07 DIAGNOSIS — I10 Essential (primary) hypertension: Secondary | ICD-10-CM | POA: Diagnosis not present

## 2017-01-08 DIAGNOSIS — I10 Essential (primary) hypertension: Secondary | ICD-10-CM | POA: Diagnosis not present

## 2017-01-09 ENCOUNTER — Other Ambulatory Visit: Payer: Self-pay | Admitting: Family Medicine

## 2017-01-09 DIAGNOSIS — I10 Essential (primary) hypertension: Secondary | ICD-10-CM | POA: Diagnosis not present

## 2017-01-10 DIAGNOSIS — I10 Essential (primary) hypertension: Secondary | ICD-10-CM | POA: Diagnosis not present

## 2017-01-11 DIAGNOSIS — I10 Essential (primary) hypertension: Secondary | ICD-10-CM | POA: Diagnosis not present

## 2017-01-12 DIAGNOSIS — I10 Essential (primary) hypertension: Secondary | ICD-10-CM | POA: Diagnosis not present

## 2017-01-13 DIAGNOSIS — I10 Essential (primary) hypertension: Secondary | ICD-10-CM | POA: Diagnosis not present

## 2017-01-14 DIAGNOSIS — I10 Essential (primary) hypertension: Secondary | ICD-10-CM | POA: Diagnosis not present

## 2017-01-15 DIAGNOSIS — I10 Essential (primary) hypertension: Secondary | ICD-10-CM | POA: Diagnosis not present

## 2017-01-16 DIAGNOSIS — I10 Essential (primary) hypertension: Secondary | ICD-10-CM | POA: Diagnosis not present

## 2017-01-17 DIAGNOSIS — I10 Essential (primary) hypertension: Secondary | ICD-10-CM | POA: Diagnosis not present

## 2017-01-18 DIAGNOSIS — I10 Essential (primary) hypertension: Secondary | ICD-10-CM | POA: Diagnosis not present

## 2017-01-18 DIAGNOSIS — R32 Unspecified urinary incontinence: Secondary | ICD-10-CM | POA: Diagnosis not present

## 2017-01-19 DIAGNOSIS — I10 Essential (primary) hypertension: Secondary | ICD-10-CM | POA: Diagnosis not present

## 2017-01-20 DIAGNOSIS — I10 Essential (primary) hypertension: Secondary | ICD-10-CM | POA: Diagnosis not present

## 2017-01-21 DIAGNOSIS — I10 Essential (primary) hypertension: Secondary | ICD-10-CM | POA: Diagnosis not present

## 2017-01-22 DIAGNOSIS — I10 Essential (primary) hypertension: Secondary | ICD-10-CM | POA: Diagnosis not present

## 2017-01-23 DIAGNOSIS — I10 Essential (primary) hypertension: Secondary | ICD-10-CM | POA: Diagnosis not present

## 2017-01-24 DIAGNOSIS — I10 Essential (primary) hypertension: Secondary | ICD-10-CM | POA: Diagnosis not present

## 2017-01-25 DIAGNOSIS — I10 Essential (primary) hypertension: Secondary | ICD-10-CM | POA: Diagnosis not present

## 2017-01-26 DIAGNOSIS — I10 Essential (primary) hypertension: Secondary | ICD-10-CM | POA: Diagnosis not present

## 2017-01-27 DIAGNOSIS — I10 Essential (primary) hypertension: Secondary | ICD-10-CM | POA: Diagnosis not present

## 2017-01-28 DIAGNOSIS — I10 Essential (primary) hypertension: Secondary | ICD-10-CM | POA: Diagnosis not present

## 2017-01-29 DIAGNOSIS — I10 Essential (primary) hypertension: Secondary | ICD-10-CM | POA: Diagnosis not present

## 2017-01-30 DIAGNOSIS — I10 Essential (primary) hypertension: Secondary | ICD-10-CM | POA: Diagnosis not present

## 2017-01-31 DIAGNOSIS — I10 Essential (primary) hypertension: Secondary | ICD-10-CM | POA: Diagnosis not present

## 2017-02-01 DIAGNOSIS — I10 Essential (primary) hypertension: Secondary | ICD-10-CM | POA: Diagnosis not present

## 2017-02-02 DIAGNOSIS — I10 Essential (primary) hypertension: Secondary | ICD-10-CM | POA: Diagnosis not present

## 2017-02-03 DIAGNOSIS — I10 Essential (primary) hypertension: Secondary | ICD-10-CM | POA: Diagnosis not present

## 2017-02-04 DIAGNOSIS — I10 Essential (primary) hypertension: Secondary | ICD-10-CM | POA: Diagnosis not present

## 2017-02-05 DIAGNOSIS — I10 Essential (primary) hypertension: Secondary | ICD-10-CM | POA: Diagnosis not present

## 2017-02-06 DIAGNOSIS — I10 Essential (primary) hypertension: Secondary | ICD-10-CM | POA: Diagnosis not present

## 2017-02-07 DIAGNOSIS — I10 Essential (primary) hypertension: Secondary | ICD-10-CM | POA: Diagnosis not present

## 2017-02-08 DIAGNOSIS — I10 Essential (primary) hypertension: Secondary | ICD-10-CM | POA: Diagnosis not present

## 2017-02-09 DIAGNOSIS — I10 Essential (primary) hypertension: Secondary | ICD-10-CM | POA: Diagnosis not present

## 2017-02-10 DIAGNOSIS — I10 Essential (primary) hypertension: Secondary | ICD-10-CM | POA: Diagnosis not present

## 2017-02-11 DIAGNOSIS — I10 Essential (primary) hypertension: Secondary | ICD-10-CM | POA: Diagnosis not present

## 2017-02-12 DIAGNOSIS — I10 Essential (primary) hypertension: Secondary | ICD-10-CM | POA: Diagnosis not present

## 2017-02-13 DIAGNOSIS — I10 Essential (primary) hypertension: Secondary | ICD-10-CM | POA: Diagnosis not present

## 2017-02-14 DIAGNOSIS — I10 Essential (primary) hypertension: Secondary | ICD-10-CM | POA: Diagnosis not present

## 2017-02-15 DIAGNOSIS — I10 Essential (primary) hypertension: Secondary | ICD-10-CM | POA: Diagnosis not present

## 2017-02-16 DIAGNOSIS — I10 Essential (primary) hypertension: Secondary | ICD-10-CM | POA: Diagnosis not present

## 2017-02-17 DIAGNOSIS — I10 Essential (primary) hypertension: Secondary | ICD-10-CM | POA: Diagnosis not present

## 2017-02-18 DIAGNOSIS — I10 Essential (primary) hypertension: Secondary | ICD-10-CM | POA: Diagnosis not present

## 2017-02-18 DIAGNOSIS — R32 Unspecified urinary incontinence: Secondary | ICD-10-CM | POA: Diagnosis not present

## 2017-02-19 DIAGNOSIS — I10 Essential (primary) hypertension: Secondary | ICD-10-CM | POA: Diagnosis not present

## 2017-02-20 DIAGNOSIS — I10 Essential (primary) hypertension: Secondary | ICD-10-CM | POA: Diagnosis not present

## 2017-02-21 DIAGNOSIS — I10 Essential (primary) hypertension: Secondary | ICD-10-CM | POA: Diagnosis not present

## 2017-02-22 DIAGNOSIS — I10 Essential (primary) hypertension: Secondary | ICD-10-CM | POA: Diagnosis not present

## 2017-02-23 DIAGNOSIS — I10 Essential (primary) hypertension: Secondary | ICD-10-CM | POA: Diagnosis not present

## 2017-02-24 DIAGNOSIS — I10 Essential (primary) hypertension: Secondary | ICD-10-CM | POA: Diagnosis not present

## 2017-02-25 DIAGNOSIS — I10 Essential (primary) hypertension: Secondary | ICD-10-CM | POA: Diagnosis not present

## 2017-02-26 DIAGNOSIS — I10 Essential (primary) hypertension: Secondary | ICD-10-CM | POA: Diagnosis not present

## 2017-02-27 DIAGNOSIS — I10 Essential (primary) hypertension: Secondary | ICD-10-CM | POA: Diagnosis not present

## 2017-02-28 DIAGNOSIS — I10 Essential (primary) hypertension: Secondary | ICD-10-CM | POA: Diagnosis not present

## 2017-03-01 DIAGNOSIS — I10 Essential (primary) hypertension: Secondary | ICD-10-CM | POA: Diagnosis not present

## 2017-03-02 DIAGNOSIS — I10 Essential (primary) hypertension: Secondary | ICD-10-CM | POA: Diagnosis not present

## 2017-03-03 DIAGNOSIS — I10 Essential (primary) hypertension: Secondary | ICD-10-CM | POA: Diagnosis not present

## 2017-03-04 DIAGNOSIS — I10 Essential (primary) hypertension: Secondary | ICD-10-CM | POA: Diagnosis not present

## 2017-03-05 DIAGNOSIS — I10 Essential (primary) hypertension: Secondary | ICD-10-CM | POA: Diagnosis not present

## 2017-03-06 DIAGNOSIS — I10 Essential (primary) hypertension: Secondary | ICD-10-CM | POA: Diagnosis not present

## 2017-03-07 DIAGNOSIS — I10 Essential (primary) hypertension: Secondary | ICD-10-CM | POA: Diagnosis not present

## 2017-03-08 DIAGNOSIS — I10 Essential (primary) hypertension: Secondary | ICD-10-CM | POA: Diagnosis not present

## 2017-03-09 DIAGNOSIS — I10 Essential (primary) hypertension: Secondary | ICD-10-CM | POA: Diagnosis not present

## 2017-03-10 DIAGNOSIS — I10 Essential (primary) hypertension: Secondary | ICD-10-CM | POA: Diagnosis not present

## 2017-03-11 DIAGNOSIS — I10 Essential (primary) hypertension: Secondary | ICD-10-CM | POA: Diagnosis not present

## 2017-03-19 DIAGNOSIS — R32 Unspecified urinary incontinence: Secondary | ICD-10-CM | POA: Diagnosis not present

## 2017-05-21 DIAGNOSIS — R32 Unspecified urinary incontinence: Secondary | ICD-10-CM | POA: Diagnosis not present

## 2017-06-18 DIAGNOSIS — R32 Unspecified urinary incontinence: Secondary | ICD-10-CM | POA: Diagnosis not present

## 2017-06-26 ENCOUNTER — Telehealth: Payer: Self-pay | Admitting: *Deleted

## 2017-06-26 NOTE — Telephone Encounter (Signed)
Valentina GuLucy called stating she is taking care of the patient and she wants to talk with Dr Lodema HongSimpson regarding patient. States that she can't take care of her anymore, per Gerome SamLucy, [name] ( patient's daughter) told her to call Dr Lodema HongSimpson when she can't handle the patient anymore. Valentina GuLucy is requesting to speak with only Dr Lodema HongSimpson. Please advise [phone]

## 2017-06-28 NOTE — Telephone Encounter (Signed)
I spoke with [name] the evening she initially called. Reports that the patient has become increasingly difficult to handle as far as needing to lift her is concerned. I called again today to get more detail. [name] has a family member [name] who is responsible for her , and I will need to hear directly from [name] that she is aware of the current situation and the need to move [name] before I initiate a search for alternate living situation. This  morning , [name] states that yesterday was a "good day" for [name], and she may just "keep her a little longer"  I made it clear to [name] that without direct communication from [name] I will not be involved in initiating a move

## 2017-07-18 DIAGNOSIS — R32 Unspecified urinary incontinence: Secondary | ICD-10-CM | POA: Diagnosis not present

## 2017-08-09 ENCOUNTER — Other Ambulatory Visit: Payer: Self-pay | Admitting: Family Medicine

## 2017-08-13 ENCOUNTER — Ambulatory Visit: Payer: Medicaid Other

## 2017-09-13 ENCOUNTER — Other Ambulatory Visit: Payer: Self-pay

## 2017-09-13 MED ORDER — AMLODIPINE BESY-BENAZEPRIL HCL 10-20 MG PO CAPS: 1.0000 | ORAL_CAPSULE | Freq: Every day | ORAL | 3 refills | Status: DC

## 2017-09-19 DIAGNOSIS — R32 Unspecified urinary incontinence: Secondary | ICD-10-CM | POA: Diagnosis not present

## 2017-10-16 ENCOUNTER — Encounter: Payer: Self-pay | Admitting: Family Medicine

## 2017-10-31 ENCOUNTER — Telehealth: Payer: Self-pay | Admitting: Family Medicine

## 2017-10-31 NOTE — Telephone Encounter (Signed)
re-faxed

## 2017-10-31 NOTE — Telephone Encounter (Signed)
Melanie, [facility] [employer], left message on nurse line regarding patient. She states that she faxed a re-certification paper on 10/16/17 for patient's incontinence supplies and has not heard anything back yet, she is calling for the status of the paperwork.  Callback# [phone]

## 2017-12-17 DIAGNOSIS — R32 Unspecified urinary incontinence: Secondary | ICD-10-CM | POA: Diagnosis not present

## 2018-01-09 ENCOUNTER — Other Ambulatory Visit: Payer: Self-pay

## 2018-01-09 MED ORDER — AMLODIPINE BESY-BENAZEPRIL HCL 10-20 MG PO CAPS: 1.0000 | ORAL_CAPSULE | Freq: Every day | ORAL | 3 refills | Status: DC

## 2018-01-14 ENCOUNTER — Ambulatory Visit: Payer: Medicare Other | Admitting: Family Medicine

## 2018-01-14 DIAGNOSIS — R32 Unspecified urinary incontinence: Secondary | ICD-10-CM | POA: Diagnosis not present

## 2018-02-11 ENCOUNTER — Ambulatory Visit: Payer: Self-pay | Admitting: Family Medicine

## 2018-02-12 DIAGNOSIS — R32 Unspecified urinary incontinence: Secondary | ICD-10-CM | POA: Diagnosis not present

## 2018-02-27 ENCOUNTER — Ambulatory Visit: Payer: Self-pay | Admitting: Family Medicine

## 2018-03-14 DIAGNOSIS — R32 Unspecified urinary incontinence: Secondary | ICD-10-CM | POA: Diagnosis not present

## 2018-04-09 ENCOUNTER — Encounter: Payer: Self-pay | Admitting: Family Medicine

## 2018-04-09 ENCOUNTER — Ambulatory Visit (INDEPENDENT_AMBULATORY_CARE_PROVIDER_SITE_OTHER): Payer: Medicaid Other | Admitting: Family Medicine

## 2018-04-09 VITALS — BP 124/74 | HR 78 | Resp 16 | Ht 60.0 in

## 2018-04-09 DIAGNOSIS — Z0000 Encounter for general adult medical examination without abnormal findings: Secondary | ICD-10-CM

## 2018-04-09 NOTE — Patient Instructions (Signed)
Wellness with Nurse in [employer], call if you neeed me sooner  No medication changes    Barrier cream is sent in toyour pharmacy .  Careful not to fall

## 2018-04-11 ENCOUNTER — Other Ambulatory Visit: Payer: Self-pay | Admitting: Family Medicine

## 2018-04-12 ENCOUNTER — Encounter: Payer: Self-pay | Admitting: Family Medicine

## 2018-04-12 NOTE — Assessment & Plan Note (Signed)
Annual exam as documented. Home safety, is also discussed.

## 2018-04-12 NOTE — Progress Notes (Signed)
    [name]     MRN: [id number]      DOB: 09/17/1906  HPI: Patient is in for annual physical exam. Pt is 84 years old with severe dementia and marked limitation in mobility. She lives in a family care home and is brought in by her caregiver Ms. [name]. There is no recent h/o fever, chills, fall, rectal bleeding. Her petite is reported as "fair" and there is no h/o constipation of loose stool Urine is not malodorous There is no reported skin breakdown. Family members visit infrequently Immunization is reviewed , and  Is up to date   PE: BP 124/74   Pulse 78   Resp 16   Ht 5' (1.524 m)   SpO2 96%   BMI 19.73 kg/m   Pleasant  female, alert  in no cardio-pulmonary distress. Afebrile. HEENT No facial trauma or asymetry. Sinuses non tender.  Extra occullar muscles intact, bitemporal wasting External ears normal, tympanic membranes clear. Oropharynx moist, no exudate.Edentulous Neck:decrased ROM,  no adenopathy,JVD or thyromegaly.No bruits.  Chest: Clear to ascultation bilaterally.No crackles or wheezes. Non tender to palpation  Breast: Not examined  Cardiovascular system; Heart sounds normal,  S1 and  S2 ,no S3.  No murmur, or thrill. Apical beat not displaced Peripheral pulses normal.  Abdomen: Soft, non tender, no organomegaly or masses. No bruits. Bowel sounds normal. No guarding, tenderness or rebound.  Rectal:  Not examined  GU: Not indicated,not examined   Musculoskeletal exam: Decreased ROM  of spine, hips , shoulders and knees. Pt wheelchair dependent for safe mobility Generalized muscle wasting and poor tone  Neurologic: Cranial nerves 2 to 12 intact. Power and  tone ,reduced  throughout. Abnormal gait, requires cane when walking and essentially wheelchair dependent  Skin: Intact, no ulceration,  Noted. Scaling noted on soles of both feet, also excessively thick toenails Pigmentation normal throughout  Psych; Normal mood  and affect.    Assessment & Plan:  Annual physical exam Annual exam as documented. Home safety, is also discussed.

## 2018-04-14 ENCOUNTER — Other Ambulatory Visit: Payer: Self-pay

## 2018-04-14 ENCOUNTER — Telehealth: Payer: Self-pay

## 2018-04-14 DIAGNOSIS — R32 Unspecified urinary incontinence: Secondary | ICD-10-CM | POA: Diagnosis not present

## 2018-04-14 MED ORDER — ZINC OXIDE 10 % EX CREA: TOPICAL_CREAM | CUTANEOUS | 0 refills | Status: AC

## 2018-04-14 NOTE — Telephone Encounter (Signed)
[name] called and stated the patient was supposed to be getting a cream/barrier ointment for the redness on her sacrum area. States the skin is not broken but wanted to put something on it to keep it from progressing

## 2018-04-14 NOTE — Telephone Encounter (Signed)
[facility]s [facility] or barrier cream/ twice daily to sacrum for 1 week , then as needed x 45 gm refill x 1

## 2018-04-14 NOTE — Telephone Encounter (Signed)
Done

## 2018-05-13 DIAGNOSIS — R32 Unspecified urinary incontinence: Secondary | ICD-10-CM | POA: Diagnosis not present

## 2018-06-12 DIAGNOSIS — R32 Unspecified urinary incontinence: Secondary | ICD-10-CM | POA: Diagnosis not present

## 2018-07-23 ENCOUNTER — Encounter: Payer: Self-pay | Admitting: Family Medicine

## 2018-07-31 DIAGNOSIS — R32 Unspecified urinary incontinence: Secondary | ICD-10-CM | POA: Diagnosis not present

## 2018-08-04 ENCOUNTER — Ambulatory Visit: Payer: Medicaid Other

## 2018-08-20 ENCOUNTER — Ambulatory Visit (INDEPENDENT_AMBULATORY_CARE_PROVIDER_SITE_OTHER): Payer: Medicare HMO

## 2018-08-20 ENCOUNTER — Telehealth: Payer: Self-pay

## 2018-08-20 VITALS — BP 124/78 | Resp 16 | Ht 60.0 in | Wt 105.0 lb

## 2018-08-20 DIAGNOSIS — Z23 Encounter for immunization: Secondary | ICD-10-CM

## 2018-08-20 DIAGNOSIS — Z0000 Encounter for general adult medical examination without abnormal findings: Secondary | ICD-10-CM

## 2018-08-20 NOTE — Patient Instructions (Signed)
[name] , Thank you for taking time to come for your Medicare Wellness Visit. I appreciate your ongoing commitment to your health goals. Please review the following plan we discussed and let me know if I can assist you in the future.   Screening recommendations/referrals: Colonoscopy: no longer indicated  Mammogram: no longer indicated  Bone Density: no longer indicated  Recommended yearly ophthalmology/optometry visit for glaucoma screening and checkup Recommended yearly dental visit for hygiene and checkup  Vaccinations: Influenza vaccine: Given today  Pneumococcal vaccine: complete Tdap vaccine: complete Shingles vaccine: ask insurance if covered (Shingrix)    Advanced directives:   Conditions/risks identified:   Next appointment: wellness in 1 year    Preventive Care 65 Years and Older, Female Preventive care refers to lifestyle choices and visits with your health care provider that can promote health and wellness. What does preventive care include?  A yearly physical exam. This is also called an annual well check.  Dental exams once or twice a year.  Routine eye exams. Ask your health care provider how often you should have your eyes checked.  Personal lifestyle choices, including:  Daily care of your teeth and gums.  Regular physical activity.  Eating a healthy diet.  Avoiding tobacco and drug use.  Limiting alcohol use.  Practicing safe sex.  Taking low-dose aspirin every day.  Taking vitamin and mineral supplements as recommended by your health care provider. What happens during an annual well check? The services and screenings done by your health care provider during your annual well check will depend on your age, overall health, lifestyle risk factors, and family history of disease. Counseling  Your health care provider may ask you questions about your:  Alcohol use.  Tobacco use.  Drug use.  Emotional well-being.  Home and relationship  well-being.  Sexual activity.  Eating habits.  History of falls.  Memory and ability to understand (cognition).  Work and work Astronomer.  Reproductive health. Screening  You may have the following tests or measurements:  Height, weight, and BMI.  Blood pressure.  Lipid and cholesterol levels. These may be checked every 5 years, or more frequently if you are over 28 years old.  Skin check.  Lung cancer screening. You may have this screening every year starting at age 33 if you have a 30-pack-year history of smoking and currently smoke or have quit within the past 15 years.  Fecal occult blood test (FOBT) of the stool. You may have this test every year starting at age 66.  Flexible sigmoidoscopy or colonoscopy. You may have a sigmoidoscopy every 5 years or a colonoscopy every 10 years starting at age 45.  Hepatitis C blood test.  Hepatitis B blood test.  Sexually transmitted disease (STD) testing.  Diabetes screening. This is done by checking your blood sugar (glucose) after you have not eaten for a while (fasting). You may have this done every 1-3 years.  Bone density scan. This is done to screen for osteoporosis. You may have this done starting at age 33.  Mammogram. This may be done every 1-2 years. Talk to your health care provider about how often you should have regular mammograms. Talk with your health care provider about your test results, treatment options, and if necessary, the need for more tests. Vaccines  Your health care provider may recommend certain vaccines, such as:  Influenza vaccine. This is recommended every year.  Tetanus, diphtheria, and acellular pertussis (Tdap, Td) vaccine. You may need a Td booster every  10 years.  Zoster vaccine. You may need this after age 76.  Pneumococcal 13-valent conjugate (PCV13) vaccine. One dose is recommended after age 82.  Pneumococcal polysaccharide (PPSV23) vaccine. One dose is recommended after age  87. Talk to your health care provider about which screenings and vaccines you need and how often you need them. This information is not intended to replace advice given to you by your health care provider. Make sure you discuss any questions you have with your health care provider. Document Released: 11/25/2015 Document Revised: 07/18/2016 Document Reviewed: 08/30/2015 Elsevier Interactive Patient Education  2017 [employer] Prevention in the Home Falls can cause injuries. They can happen to people of all ages. There are many things you can do to make your home safe and to help prevent falls. What can I do on the outside of my home?  Regularly fix the edges of walkways and driveways and fix any cracks.  Remove anything that might make you trip as you walk through a door, such as a raised step or threshold.  Trim any bushes or trees on the path to your home.  Use bright outdoor lighting.  Clear any walking paths of anything that might make someone trip, such as rocks or tools.  Regularly check to see if handrails are loose or broken. Make sure that both sides of any steps have handrails.  Any raised decks and porches should have guardrails on the edges.  Have any leaves, snow, or ice cleared regularly.  Use sand or salt on walking paths during winter.  Clean up any spills in your garage right away. This includes oil or grease spills. What can I do in the bathroom?  Use night lights.  Install grab bars by the toilet and in the tub and shower. Do not use towel bars as grab bars.  Use non-skid mats or decals in the tub or shower.  If you need to sit down in the shower, use a plastic, non-slip stool.  Keep the floor dry. Clean up any water that spills on the floor as soon as it happens.  Remove soap buildup in the tub or shower regularly.  Attach bath mats securely with double-sided non-slip rug tape.  Do not have throw rugs and other things on the floor that can make  you trip. What can I do in the bedroom?  Use night lights.  Make sure that you have a light by your bed that is easy to reach.  Do not use any sheets or blankets that are too big for your bed. They should not hang down onto the floor.  Have a firm chair that has side arms. You can use this for support while you get dressed.  Do not have throw rugs and other things on the floor that can make you trip. What can I do in the kitchen?  Clean up any spills right away.  Avoid walking on wet floors.  Keep items that you use a lot in easy-to-reach places.  If you need to reach something above you, use a strong step stool that has a grab bar.  Keep electrical cords out of the way.  Do not use floor polish or wax that makes floors slippery. If you must use wax, use non-skid floor wax.  Do not have throw rugs and other things on the floor that can make you trip. What can I do with my stairs?  Do not leave any items on the stairs.  Make sure  that there are handrails on both sides of the stairs and use them. Fix handrails that are broken or loose. Make sure that handrails are as long as the stairways.  Check any carpeting to make sure that it is firmly attached to the stairs. Fix any carpet that is loose or worn.  Avoid having throw rugs at the top or bottom of the stairs. If you do have throw rugs, attach them to the floor with carpet tape.  Make sure that you have a light switch at the top of the stairs and the bottom of the stairs. If you do not have them, ask someone to add them for you. What else can I do to help prevent falls?  Wear shoes that:  Do not have high heels.  Have rubber bottoms.  Are comfortable and fit you well.  Are closed at the toe. Do not wear sandals.  If you use a stepladder:  Make sure that it is fully opened. Do not climb a closed stepladder.  Make sure that both sides of the stepladder are locked into place.  Ask someone to hold it for you, if  possible.  Clearly mark and make sure that you can see:  Any grab bars or handrails.  First and last steps.  Where the edge of each step is.  Use tools that help you move around (mobility aids) if they are needed. These include:  Canes.  Walkers.  Scooters.  Crutches.  Turn on the lights when you go into a dark area. Replace any light bulbs as soon as they burn out.  Set up your furniture so you have a clear path. Avoid moving your furniture around.  If any of your floors are uneven, fix them.  If there are any pets around you, be aware of where they are.  Review your medicines with your doctor. Some medicines can make you feel dizzy. This can increase your chance of falling. Ask your doctor what other things that you can do to help prevent falls. This information is not intended to replace advice given to you by your health care provider. Make sure you discuss any questions you have with your health care provider. Document Released: 08/25/2009 Document Revised: 04/05/2016 Document Reviewed: 12/03/2014 Elsevier Interactive Patient Education  2017 [employer].

## 2018-08-20 NOTE — Progress Notes (Signed)
Subjective:   [name] is a 84 y.o. female who presents for Medicare Annual (Subsequent) preventive examination.  Review of Systems:   Cardiac Risk Factors include: advanced age (>79men, >69 women);hypertension;sedentary lifestyle     Objective:     Vitals: BP 124/78   Resp 16   Ht 5' (1.524 m)   Wt 105 lb (47.6 kg)   SpO2 93%   BMI 20.51 kg/m   Body mass index is 20.51 kg/m.  Advanced Directives 08/20/2018 08/13/2014  Does Patient Have a Medical Advance Directive? No No  Would patient like information on creating a medical advance directive? Yes (ED - Information included in AVS) No - patient declined information    Tobacco Social History   Tobacco Use  Smoking Status Never Smoker  Smokeless Tobacco Never Used     Counseling given: Not Answered   Clinical Intake:  Pre-visit preparation completed: No  Pain : No/denies pain Pain Score: 0-No pain     Nutritional Status: BMI of 19-24  Normal Diabetes: No  How often do you need to have someone help you when you read instructions, pamphlets, or other written materials from your doctor or pharmacy?: 5 - Always     Information entered by :: brandi hudy LPN   Past Medical History:  Diagnosis Date  . Anxiety   . Dementia (HCC)   . Diverticular disease   . DJD (degenerative joint disease)   . Hypertension    Past Surgical History:  Procedure Laterality Date  . ABDOMINAL HYSTERECTOMY    . ABDOMINAL SURGERY     Family History  Family history unknown: Yes   Social History   Socioeconomic History  . Marital status: Widowed    Spouse name: Not on file  . Number of children: Not on file  . Years of education: Not on file  . Highest education level: Not on file  Occupational History  . Occupation: retired   Engineer, production  . Financial resource strain: Not hard at all  . Food insecurity:    Worry: Never true    Inability: Never true  . Transportation needs:    Medical: No    Non-medical:  No  Tobacco Use  . Smoking status: Never Smoker  . Smokeless tobacco: Never Used  Substance and Sexual Activity  . Alcohol use: No  . Drug use: No  . Sexual activity: Never    Birth control/protection: None  Lifestyle  . Physical activity:    Days per week: 0 days    Minutes per session: 0 min  . Stress: Not at all  Relationships  . Social connections:    Talks on phone: Never    Gets together: Never    Attends religious service: 1 to 4 times per year    Active member of club or organization: No    Attends meetings of clubs or organizations: Never    Relationship status: Widowed  Other Topics Concern  . Not on file  Social History Narrative  . Not on file    Outpatient Encounter Medications as of 08/20/2018  Medication Sig  . amLODipine-benazepril (LOTREL) 10-20 MG capsule TAKE 1 CAPSULE BY MOUTH ONCE DAILY  . ZINC OXIDE, TOPICAL, 10 % CREA Apply twice daily as needed   No facility-administered encounter medications on file as of 08/20/2018.     Activities of Daily Living In your present state of health, do you have any difficulty performing the following activities: 08/20/2018  Hearing? Y  Vision?  Y  Difficulty concentrating or making decisions? Y  Walking or climbing stairs? Y  Dressing or bathing? Y  Doing errands, shopping? Y  Preparing Food and eating ? Y  Using the Toilet? Y  In the past six months, have you accidently leaked urine? Y  Do you have problems with loss of bowel control? Y  Managing your Medications? Y  Managing your Finances? Y  Housekeeping or managing your Housekeeping? Y  Comment in facility care   Some recent data might be hidden    Patient Care Team: Kerri Perches, MD as PCP - General    Assessment:   This is a routine wellness examination for [name].  Exercise Activities and Dietary recommendations Current Exercise Habits: The patient does not participate in regular exercise at present, Exercise limited by: orthopedic  condition(s);Other - see comments(84 years old. Walks short distances with assistance )  Goals   None     Fall Risk Fall Risk  08/20/2018 04/09/2018 08/07/2016 05/11/2015 03/11/2014  Falls in the past year? No No No No No  Risk for fall due to : - - - - -   Is the patient's home free of loose throw rugs in walkways, pet beds, electrical cords, etc?   yes      Grab bars in the bathroom? yes      Handrails on the stairs?   yes      Adequate lighting?   yes  Timed Get Up and Go performed: pt unable to ambulate   Depression Screen PHQ 2/9 Scores 08/20/2018 04/09/2018 08/07/2016 05/11/2015  PHQ - 2 Score 0 4 0 0  PHQ- 9 Score - 14 - -     Cognitive Function        Immunization History  Administered Date(s) Administered  . Influenza,inj,Quad PF,6+ Mos 10/05/2013, 12/07/2014, 09/22/2015  . PPD Test 02/27/2012, 05/13/2012  . Pneumococcal Conjugate-13 05/11/2015  . Pneumococcal Polysaccharide-23 10/05/2013  . Tdap 03/20/2011    Qualifies for Shingles Vaccine? No longer indicated  Screening Tests Health Maintenance  Topic Date Due  . INFLUENZA VACCINE  06/12/2018  . DEXA SCAN  06/30/2020 (Originally 09/18/1973)  . TETANUS/TDAP  03/19/2021  . PNA vac Low Risk Adult  Completed    Cancer Screenings: Lung: Low Dose CT Chest recommended if Age 40-80 years, 30 pack-year currently smoking OR have quit w/in 15years. Patient does not qualify. Breast:  Up to date on Mammogram? Yes - complete Up to date of Bone Density/Dexa? Yes complete Colorectal: no longer indicated   Additional Screenings:: Hepatitis C Screening:      Plan:     I have personally reviewed and noted the following in the patient's chart:   . Medical and social history . Use of alcohol, tobacco or illicit drugs  . Current medications and supplements . Functional ability and status . Nutritional status . Physical activity . Advanced directives . List of other physicians . Hospitalizations, surgeries, and ER  visits in previous 12 months . Vitals . Screenings to include cognitive, depression, and falls . Referrals and appointments  In addition, I have reviewed and discussed with patient certain preventive protocols, quality metrics, and best practice recommendations. A written personalized care plan for preventive services as well as general preventive health recommendations were provided to patient.     Everitt Amber, LPN, LPN  16/11/958

## 2018-08-20 NOTE — Telephone Encounter (Signed)
[name] was here for her wellness visit and the caregiver had me look at a place on her bottom and its a small area of pink breakdown and the area where the skin is open is about 0.5 cm and they have been using zinc ointment and want to know if there is anything you want to recommend or send in that will work better. Please advise

## 2018-08-21 ENCOUNTER — Other Ambulatory Visit: Payer: Self-pay | Admitting: Family Medicine

## 2018-08-21 MED ORDER — CEPHALEXIN 250 MG PO CAPS: 250.0000 mg | ORAL_CAPSULE | Freq: Two times a day (BID) | ORAL | 0 refills | Status: DC

## 2018-08-21 NOTE — Telephone Encounter (Signed)
Facility aware.

## 2018-08-21 NOTE — Telephone Encounter (Signed)
I sent 5 days of keflex to her pharmacy, [place]

## 2018-08-27 DIAGNOSIS — R32 Unspecified urinary incontinence: Secondary | ICD-10-CM | POA: Diagnosis not present

## 2018-09-01 ENCOUNTER — Telehealth: Payer: Self-pay | Admitting: Family Medicine

## 2018-09-01 NOTE — Telephone Encounter (Signed)
Kendall with the facility is calling back as the Sore is not healing on the pt, not sure if they need more cream, please call [phone]

## 2018-09-02 NOTE — Telephone Encounter (Signed)
Spoke with caregiver and he stated that the sore on patients bottom isn't getting any better and that it actually may be worse. Let him know I'd send a message to Dr.Simpson and wait for a response and would be back in contact with him with verbal understanding.

## 2018-09-02 NOTE — Telephone Encounter (Signed)
Called and spoke with Weston Brass at [facility] to see if they could see the patient asap for services and he is going to call me back as soon as he finds out. If unable will schedule the patient for tomorrow

## 2018-09-02 NOTE — Telephone Encounter (Signed)
Work in to be seen or send home health nurse , nif cannot go today or tomorrow, she needs to be seen here today or tomorrow, pls follow through

## 2018-09-04 ENCOUNTER — Ambulatory Visit (INDEPENDENT_AMBULATORY_CARE_PROVIDER_SITE_OTHER): Payer: Medicare HMO | Admitting: Family Medicine

## 2018-09-04 ENCOUNTER — Encounter: Payer: Self-pay | Admitting: Family Medicine

## 2018-09-04 VITALS — BP 142/60 | HR 75 | Resp 12

## 2018-09-04 DIAGNOSIS — R32 Unspecified urinary incontinence: Secondary | ICD-10-CM

## 2018-09-04 DIAGNOSIS — R600 Localized edema: Secondary | ICD-10-CM | POA: Diagnosis not present

## 2018-09-04 DIAGNOSIS — I10 Essential (primary) hypertension: Secondary | ICD-10-CM

## 2018-09-04 DIAGNOSIS — Z0000 Encounter for general adult medical examination without abnormal findings: Secondary | ICD-10-CM | POA: Diagnosis not present

## 2018-09-04 DIAGNOSIS — L98421 Non-pressure chronic ulcer of back limited to breakdown of skin: Secondary | ICD-10-CM

## 2018-09-04 DIAGNOSIS — Z23 Encounter for immunization: Secondary | ICD-10-CM

## 2018-09-04 DIAGNOSIS — F0390 Unspecified dementia without behavioral disturbance: Secondary | ICD-10-CM | POA: Diagnosis not present

## 2018-09-04 NOTE — Assessment & Plan Note (Signed)
No infection or drainage limited  To skin Tegaderm applied and # 5 given

## 2018-09-04 NOTE — Assessment & Plan Note (Addendum)
Bilateral,  compression hose 15 to 20 mm Hg and leg elevation

## 2018-09-04 NOTE — Patient Instructions (Addendum)
F/u in 6 months, call if you need me sooner  Use Tegaderm dressing over ulcer and change every 3 days, this protects skin by keeping it dry. No skin infection noted, if gets red and pus drains , need to bring her back Also will send for protective seat cushion and mattress protection and need to turn her often to reduce pressure on her low back   Compression hose are prescribed for wearing every day and elevate your legs  Thank you  for choosing [facility]. We consider it a privelige to serve you.  Delivering excellent health care in a caring and  compassionate way is our goal.  Partnering with you,  so that together we can achieve this goal is our strategy.

## 2018-09-08 NOTE — Telephone Encounter (Signed)
Wasn't ab le to see her so Dr seen her for OV the next day

## 2018-09-11 ENCOUNTER — Other Ambulatory Visit: Payer: Self-pay | Admitting: Family Medicine

## 2018-10-11 ENCOUNTER — Encounter: Payer: Self-pay | Admitting: Family Medicine

## 2018-10-11 NOTE — Assessment & Plan Note (Signed)
Adequate control, no change in medication 

## 2018-10-11 NOTE — Progress Notes (Signed)
   [name]     MRN: [id number]      DOB: 11/13/1905   HPI [name] is here for follow up and re-evaluation of chronic medical conditions, medication management   Preventive health is updated, specifically  Cancer screening and Immunization.  Flu vaccine is administered Caregiver is concerned about a sacral ulcer which despite a recent short course of antibiotic persists,also c/o bilateral leg swelling  ROS History from caregiver Denies recent fever or chills. Denies sinus pressure, nasal congestion, ear pain or sore throat. Denies chest congestion, productive cough or wheezing. Denies  nausea, vomiting,diarrhea or constipation.   Chronic incontinence. Chronic  joint pain, and limitation in mobility.  PE  BP (!) 142/60 (BP Location: Right Arm, Patient Position: Sitting, Cuff Size: Normal)   Pulse 75   Resp 12   SpO2 96% Comment: room air  Patient alert and disoriented  oriented and in no cardiopulmonary distress.  HEENT: No facial asymmetry, EOMI,   oropharynx pink and moist.  Neck decreased ROM  no JVD, no mass.  Chest: Clear to auscultation bilaterally.  CVS: S1, S2 no murmurs, no S3.Regular rate.  ABD: Soft non tender.   Ext: No edema  MS: Adequate ROM spine, shoulders, hips and knees.  Skin: Ulcer limited to skin breakdown only, dime sized on sacrum, no purulent drainage or erythema noted   Assessment & Plan  Sacral ulcer, limited to breakdown of skin (HCC) No infection or drainage limited  To skin Tegaderm applied and # 5 given  Bilateral leg edema Bilateral,  compression hose 15 to 20 mm Hg and leg elevation  Essential hypertension Adequate control, no change in medication  Incontinence of urine Need for incontinence undergarment and chucks  Senile dementia Age related severe dementia, no danger to herself or to caregiver, incapable of physical aggression  Need for immunization against influenza After obtaining informed consent, the  vaccine is  administered

## 2018-10-11 NOTE — Assessment & Plan Note (Signed)
After obtaining informed consent, the vaccine is  administered 

## 2018-10-11 NOTE — Assessment & Plan Note (Signed)
Need for incontinence undergarment and chucks

## 2018-10-11 NOTE — Assessment & Plan Note (Signed)
Age related severe dementia, no danger to herself or to caregiver, incapable of physical aggression

## 2018-10-17 DIAGNOSIS — R32 Unspecified urinary incontinence: Secondary | ICD-10-CM | POA: Diagnosis not present

## 2018-10-22 ENCOUNTER — Telehealth: Payer: Self-pay | Admitting: *Deleted

## 2018-10-22 NOTE — Telephone Encounter (Signed)
[name] with [facility] [employer] was calling in reference to the fax sent over about medical incontinence supplies. She would like a call back.

## 2018-10-22 NOTE — Telephone Encounter (Signed)
[name] returned call requesting call back

## 2018-10-22 NOTE — Telephone Encounter (Signed)
Returned [employer] call with no answer. Left message requesting call back.

## 2018-10-23 NOTE — Telephone Encounter (Signed)
Spoke with Molson Coors BrewingMelanie. Told her I haven't received a fax regarding supplies. Asked her to refax request and put Attn Abby or Brandi on it.

## 2018-10-29 DIAGNOSIS — I129 Hypertensive chronic kidney disease with stage 1 through stage 4 chronic kidney disease, or unspecified chronic kidney disease: Secondary | ICD-10-CM | POA: Diagnosis not present

## 2018-10-29 DIAGNOSIS — F0390 Unspecified dementia without behavioral disturbance: Secondary | ICD-10-CM | POA: Diagnosis not present

## 2018-10-29 DIAGNOSIS — T68XXXA Hypothermia, initial encounter: Secondary | ICD-10-CM | POA: Diagnosis not present

## 2018-10-29 DIAGNOSIS — R4020 Unspecified coma: Secondary | ICD-10-CM | POA: Diagnosis not present

## 2018-10-29 DIAGNOSIS — I491 Atrial premature depolarization: Secondary | ICD-10-CM | POA: Diagnosis not present

## 2018-10-29 DIAGNOSIS — N189 Chronic kidney disease, unspecified: Secondary | ICD-10-CM | POA: Diagnosis not present

## 2018-10-29 DIAGNOSIS — R52 Pain, unspecified: Secondary | ICD-10-CM | POA: Diagnosis not present

## 2018-10-29 DIAGNOSIS — I499 Cardiac arrhythmia, unspecified: Secondary | ICD-10-CM | POA: Diagnosis not present

## 2018-10-29 DIAGNOSIS — N179 Acute kidney failure, unspecified: Secondary | ICD-10-CM | POA: Diagnosis not present

## 2018-10-29 DIAGNOSIS — R739 Hyperglycemia, unspecified: Secondary | ICD-10-CM | POA: Diagnosis not present

## 2018-10-29 DIAGNOSIS — J189 Pneumonia, unspecified organism: Secondary | ICD-10-CM | POA: Diagnosis not present

## 2018-10-29 DIAGNOSIS — R680 Hypothermia, not associated with low environmental temperature: Secondary | ICD-10-CM | POA: Diagnosis not present

## 2018-10-29 DIAGNOSIS — R55 Syncope and collapse: Secondary | ICD-10-CM | POA: Diagnosis not present

## 2018-10-29 DIAGNOSIS — R0902 Hypoxemia: Secondary | ICD-10-CM | POA: Diagnosis not present

## 2018-11-11 ENCOUNTER — Ambulatory Visit (INDEPENDENT_AMBULATORY_CARE_PROVIDER_SITE_OTHER): Payer: Medicare HMO | Admitting: Family Medicine

## 2018-11-11 ENCOUNTER — Encounter: Payer: Self-pay | Admitting: Family Medicine

## 2018-11-11 ENCOUNTER — Other Ambulatory Visit: Payer: Self-pay | Admitting: Family Medicine

## 2018-11-11 VITALS — BP 112/52 | HR 66 | Resp 12 | Ht 60.0 in

## 2018-11-11 DIAGNOSIS — F0390 Unspecified dementia without behavioral disturbance: Secondary | ICD-10-CM

## 2018-11-11 DIAGNOSIS — Z09 Encounter for follow-up examination after completed treatment for conditions other than malignant neoplasm: Secondary | ICD-10-CM | POA: Diagnosis not present

## 2018-11-11 DIAGNOSIS — S37009A Unspecified injury of unspecified kidney, initial encounter: Secondary | ICD-10-CM

## 2018-11-11 DIAGNOSIS — E440 Moderate protein-calorie malnutrition: Secondary | ICD-10-CM | POA: Diagnosis not present

## 2018-11-11 DIAGNOSIS — J189 Pneumonia, unspecified organism: Secondary | ICD-10-CM

## 2018-11-11 NOTE — Patient Instructions (Addendum)
F/U in 4 months, call if you need me before  Repeat CXR and labs, CBC and diff , cmp and EGFR  As stat This Thursday at the hospital, collect order at checkout, we will be in touch at [place] do need a higher level of care since you are unable to walk on your own and require more supervision and help, very important that this is carried out as soon as  possible

## 2018-11-13 ENCOUNTER — Other Ambulatory Visit (HOSPITAL_COMMUNITY)
Admission: RE | Admit: 2018-11-13 | Discharge: 2018-11-13 | Disposition: A | Discharge status: Home / Self Care | Payer: Medicare HMO | Source: Ambulatory Visit | Attending: Family Medicine | Admitting: Family Medicine

## 2018-11-13 DIAGNOSIS — S37009A Unspecified injury of unspecified kidney, initial encounter: Secondary | ICD-10-CM | POA: Diagnosis not present

## 2018-11-13 DIAGNOSIS — J189 Pneumonia, unspecified organism: Secondary | ICD-10-CM | POA: Diagnosis not present

## 2018-11-13 LAB — COMPREHENSIVE METABOLIC PANEL
ALT: 13 U/L (ref 0–44)
AST: 34 U/L (ref 15–41)
Albumin: 3.2 g/dL — ABNORMAL LOW (ref 3.5–5.0)
Alkaline Phosphatase: 87 U/L (ref 38–126)
Anion gap: 11 (ref 5–15)
BUN: 20 mg/dL (ref 8–23)
CHLORIDE: 111 mmol/L (ref 98–111)
CO2: 17 mmol/L — ABNORMAL LOW (ref 22–32)
CREATININE: 1.33 mg/dL — AB (ref 0.44–1.00)
Calcium: 7.4 mg/dL — ABNORMAL LOW (ref 8.9–10.3)
GFR calc Af Amer: 35 mL/min — ABNORMAL LOW (ref >60)
GFR, EST NON AFRICAN AMERICAN: 31 mL/min — AB (ref >60)
Glucose, Bld: 117 mg/dL — ABNORMAL HIGH (ref 70–99)
Potassium: 3.4 mmol/L — ABNORMAL LOW (ref 3.5–5.1)
Sodium: 139 mmol/L (ref 135–145)
Total Bilirubin: 2 mg/dL — ABNORMAL HIGH (ref 0.3–1.2)
Total Protein: 6 g/dL — ABNORMAL LOW (ref 6.5–8.1)

## 2018-11-13 LAB — CBC WITH DIFFERENTIAL/PLATELET
Abs Immature Granulocytes: 0.02 10*3/uL (ref 0.00–0.07)
Basophils Absolute: 0 10*3/uL (ref 0.0–0.1)
Basophils Relative: 0 %
Eosinophils Absolute: 0.1 10*3/uL (ref 0.0–0.5)
Eosinophils Relative: 1 %
HCT: 37.9 % (ref 36.0–46.0)
Hemoglobin: 14 g/dL (ref 12.0–15.0)
Immature Granulocytes: 0 %
Lymphocytes Relative: 14 %
Lymphs Abs: 1.1 10*3/uL (ref 0.7–4.0)
MCH: 30.1 pg (ref 26.0–34.0)
MCHC: 36.9 g/dL — ABNORMAL HIGH (ref 30.0–36.0)
MCV: 81.5 fL (ref 80.0–100.0)
MONOS PCT: 7 %
Monocytes Absolute: 0.6 10*3/uL (ref 0.1–1.0)
Neutro Abs: 6.2 10*3/uL (ref 1.7–7.7)
Neutrophils Relative %: 78 %
Platelets: 203 10*3/uL (ref 150–400)
RBC: 4.65 MIL/uL (ref 3.87–5.11)
RDW: 17.2 % — ABNORMAL HIGH (ref 11.5–15.5)
WBC: 8 10*3/uL (ref 4.0–10.5)
nRBC: 0 % (ref 0.0–0.2)

## 2018-11-16 ENCOUNTER — Encounter: Payer: Self-pay | Admitting: Family Medicine

## 2018-11-16 NOTE — Progress Notes (Signed)
   [name]     MRN: [id number]      DOB: 10-08-1906   HPI [name] is here for follow up of recent hospitalization at [facility] for pneumonia and failure to thrive. She has dx of end stage dementia and is an advanced age Caregiver states no longer to care for her as she is immobile , and refusing food, I strongly recommend higher lever of care/ hospice  ROS See HPI    PE  BP (!) 112/52 (BP Location: Left Arm, Patient Position: Sitting, Cuff Size: Normal)   Pulse 66   Resp 12   Ht 5' (1.524 m)   SpO2 92% Comment: room air  BMI 20.51 kg/m   Patient alert  and in no cardiopulmonary distress.  HEENT: No facial asymmetry, EOMI,   oropharynx pink and moist.  Neck decreased ROM no JVD, no mass.  Chest: Clear to auscultation bilaterally.  CVS: S1, S2 no murmurs, no S3.Regular rate.  ABD: Soft non tender.   Ext: No edema  MS:  Decreased  ROM spine, shoulders, hips and knees.   Assessment & Plan  Hospital discharge follow-up Pt recovered from pneumonia however deterioration in quality of life with reduced mobility and refusing food. Dx with protein energy malnutrition also, rept labs ordered. Higher level of care for end stage dementia with malnutrition indicated  Senile dementia End stage , refusing food, needs higher level of care or hospice  Protein-energy malnutrition (HCC) Hospice and  higher level of care indicated due to severe dementia pt refusing food

## 2018-11-17 ENCOUNTER — Telehealth: Payer: Self-pay | Admitting: *Deleted

## 2018-11-17 NOTE — Telephone Encounter (Signed)
Noted, please send completed form for referral , I will leave with you and let daughter and facility know

## 2018-11-17 NOTE — Telephone Encounter (Signed)
error 

## 2018-11-17 NOTE — Telephone Encounter (Signed)
[facility] an [facility] living facility in [place]. Daughter was requesting placement as they are not able to care for her at this facility. She is not eating or drinking anything anymore. If able to would like Hospice care.

## 2018-11-17 NOTE — Telephone Encounter (Signed)
Need to fax last note once signed

## 2018-11-18 ENCOUNTER — Telehealth: Payer: Self-pay

## 2018-11-18 DIAGNOSIS — E46 Unspecified protein-calorie malnutrition: Secondary | ICD-10-CM | POA: Insufficient documentation

## 2018-11-18 DIAGNOSIS — Z09 Encounter for follow-up examination after completed treatment for conditions other than malignant neoplasm: Secondary | ICD-10-CM | POA: Insufficient documentation

## 2018-11-18 NOTE — Telephone Encounter (Signed)
[name] has set up hospice with [facility] per daughter request

## 2018-11-18 NOTE — Assessment & Plan Note (Signed)
Hospice and  higher level of care indicated due to severe dementia pt refusing food

## 2018-11-18 NOTE — Assessment & Plan Note (Signed)
End stage , refusing food, needs higher level of care or hospice

## 2018-11-18 NOTE — Assessment & Plan Note (Signed)
Pt recovered from pneumonia however deterioration in quality of life with reduced mobility and refusing food. Dx with protein energy malnutrition also, rept labs ordered. Higher level of care for end stage dementia with malnutrition indicated

## 2018-11-18 NOTE — Telephone Encounter (Signed)
Kendall with [facility] called this morning and stated that [name] had been refusing to eat or drink for the past two days, and he wanted advise and a higher level of care for [name].  This nurse spoke with daughter Aldean JewettMillie, and confirmed that daughter was in agreement with hospice care for her mother. [facility] was called, spoke with Hospital doctorAmber in referral dept. And she spoke with her supervisor who in turn contacted Ms. Herrig's daughter Aldean JewettMillie, and set up an appointment time to go out to [facility] this afternoon and evaluate [name] for placement into hospice care. Amber stated that they would find a bed for [name] and have her moved either today or tomorrow. All parties are aware of the plan.   Hospital [facility]Amber at [facility] [phone] referral department  [facility] with [facility]   Penni BombardKendall @ [facility] [phone]

## 2018-11-19 ENCOUNTER — Telehealth: Payer: Self-pay

## 2018-11-19 ENCOUNTER — Telehealth: Payer: Self-pay | Admitting: Family Medicine

## 2018-11-19 NOTE — Telephone Encounter (Signed)
Per Alona Bene- she is supposed to be getting evaluated by hospice today.

## 2018-11-19 NOTE — Telephone Encounter (Signed)
Telephone call to patient's daughter after patient was non-admit to hospice services. Kendal at [facility] had made hospice nurse and daughter aware of patient's need for higher level of care. Discussed with daughter Aldean Jewett, process of transitioning to SNF placement. Aldean Jewett is to look into facilities of choice in [place], per her request, and this SW will contact Dr. Anthony Sar office to request an updated FL-2. SW will fax information to facilities of family's choice. Request has already been made for order for palliative care at patient's current residence. Call to Kendal at [facility] to update him of above. Kendal verbalizes understanding and appreciation.

## 2018-11-19 NOTE — Telephone Encounter (Signed)
[name] (daughter) is calling with Concern over the pt, as [name] states that Pt is swollen on the right side hand and foot. Pt told [name] that she fell. Janey Greaser and Mrs Valentina Gu with [facility] stated that PT is retaining fluid.  [name] states that the pt needs puree food, that the PT continues to tell her she is hungry and all [facility] give her is a half of sandwich.  She wants XRAYS ordered and done, as she believes that PT has fallen. I advised to send the pt to the Hospital to be evaluated.

## 2018-11-19 NOTE — Telephone Encounter (Signed)
Visit completed and hospivce involved since yesterday

## 2018-11-19 NOTE — Telephone Encounter (Signed)
See Joyce's update

## 2018-11-19 NOTE — Telephone Encounter (Signed)
Since Alona Bene is not here today please follow up on this and ensure completed or if there is anything else I need to do/ office please let me know

## 2018-11-21 ENCOUNTER — Other Ambulatory Visit: Payer: Medicaid Other | Admitting: Student

## 2018-11-21 ENCOUNTER — Other Ambulatory Visit: Payer: Medicare HMO

## 2018-11-21 ENCOUNTER — Other Ambulatory Visit: Payer: Medicare HMO | Admitting: Student

## 2018-11-21 DIAGNOSIS — Z515 Encounter for palliative care: Secondary | ICD-10-CM | POA: Diagnosis not present

## 2018-11-21 NOTE — Progress Notes (Signed)
[facility] Telephone: [phone] Fax: [phone]  PATIENT NAME: [name] DOB: 02-25-1906 MRN: [id number]  PRIMARY CARE PROVIDER:   Kerri Perches, MD  REFERRING PROVIDER:  Kerri Perches, MD [address] [place], [place] [address]  RESPONSIBLE PARTY:  Daughter, [name]  ASSESSMENT:  [name] is observed resting in bed. She arouses to stimulation. She does answer direct questions. Explained role of Palliative Medicine. We discussed goals of care. Assistance with placement of higher level of care is needed as she no longer meets criteria for care home due to not being ambulatory. Palliative SW [name] is present and assisting with placement. We discussed Hospice coming in to provide additional support; caregivers are in agreement with Hospice assessment in the home at this time. Discussed equipment needs; family declines any equipment at this time. [name] has hydrocolloid dressing in place to left buttocks; superficial shearing noted to right medial knee, barrier cream applied.     RECOMMENDATIONS and PLAN:  1. CODE STATUS. DNR, left in the home.  2. Medical goals of therapy: Patient being referred to Hospice for admission assessment due diagnosis of Dementia.  3. Symptom management: skin breakdown-continue to apply hydrocolloid dressing to left buttocks; turn and reposition every two hours. Place pillow in between knees to prevent shearing/friction.  4. Discharge Planning: Patient will continue to reside family care home. SW currently seeking SNF placement.  5. Emotional/spiritual support: Discussed with caregivers [address] and [name]. They are encouraged to call with questions.  Hospice Referral intake notified to proceed with Hospice assessment. Palliative Medicine to continue to follow until she is admitted to Hospice services.   I spent 45 minutes providing this consultation,  from 1:30pm to 2:15pm. More than 50% of the  time in this consultation was spent coordinating communication.   HISTORY OF PRESENT ILLNESS:  [name] is a 85 y.o. year old female with multiple medical problems including senile dementia, protein calorie malnutrition, anxiety, diverticular disease, degenerative joint disease, hypertension, sacral ulcer. Palliative Care was asked to help address goals of care and assist with higher level of care placement. Hospice had came out to assess patient earlier this week. Caregivers thought [name] was beng evaluated for Hospice home transfer, and it was determined that she did not meet criteria for Hospice home at that time. Caregivers elected to not have Hospice in the home at that time. [name] has resided at [facility] since 2013. Care staff report patient declining since hospitalization for pneumonia in December, 2019. [name] is no longer getting out of bed; she had previously been ambulating with assistance per [name] up until several weeks ago. Her appetite has declined; she is eating bites and taking nutritional supplements. She is sleeping throughout the day; previously she would be up throughout the day and engaging in conversation. She has started to develop skin breakdown in the past 24 hours per caregivers [name] and [name].   CODE STATUS: DNR, filled out and left in the home.   PPS: 20% HOSPICE ELIGIBILITY/DIAGNOSIS: Yes, dementia  PAST MEDICAL HISTORY:  Past Medical History:  Diagnosis Date  . Anxiety   . Dementia (HCC)   . Diverticular disease   . DJD (degenerative joint disease)   . Hypertension     SOCIAL HX:  Social History   Tobacco Use  . Smoking status: Never Smoker  . Smokeless tobacco: Never Used  Substance Use Topics  . Alcohol use: No  ALLERGIES:  Allergies  Allergen Reactions  . Aspirin Other (See Comments)    Past h/o severe diverticular bleed      PERTINENT MEDICATIONS:  Outpatient Encounter Medications as of  11/21/2018  Medication Sig  . amLODipine-benazepril (LOTREL) 10-20 MG capsule TAKE 1 CAPSULE BY MOUTH ONCE DAILY  . Dimethicone-Zinc Oxide-Vit A-D (A & D ZINC OXIDE) CREA APPLY TWICE DAILY AS NEEDED.  Marland Kitchen ZINC OXIDE, TOPICAL, 10 % CREA Apply twice daily as needed   No facility-administered encounter medications on file as of 11/21/2018.     PHYSICAL EXAM:   General: NAD, frail appearing, thin Cardiovascular: regular rate and rhythm Pulmonary: clear ant fields Abdomen: soft, nontender, + bowel sounds Jackson: no suprapubic tenderness Extremities: generalized edema to bilateral hands, 2+ non-pitting edema to lower extremities Skin: skin shearing to right medial knee, wound to left buttocks Neurological: Weakness but otherwise nonfocal  Margaret Cook, NP

## 2018-11-22 NOTE — Progress Notes (Signed)
[facility] SW NOTE  PATIENT NAME: [name] DOB: 03-31-1906 MRN: [id number]  PRIMARY CARE PROVIDER: Kerri Perches, MD  RESPONSIBLE PARTY:  Acct ID - Guarantor Home Phone Work Phone Relationship Acct Type  [id number] Clinton Sawyer,* [id number]  Self P/F     [address] , [place], [place] [address]     PLAN OF CARE and INTERVENTIONS:             1. GOALS OF CARE/ ADVANCE CARE PLANNING:  Patient is now a DNR per family. Signed DNR left at family care home. Family and staff verbalize wanting to focus on comfort care. 2. SOCIAL/EMOTIONAL/SPIRITUAL ASSESSMENT/ INTERVENTIONS:  Patient currently resides at [facility] home in [place], [place]. She has been there since 2013. Owners of the home, [place]lin and Penni Bombard, who are mother and son are present for this visit. Patient found laying in bed during visit, tries to speak but unable to articulate meaningful conversation. Patient sleeping upon arrival, awakens to touch, but quickly returns to sleep. Staff states that she has always been very independent and has made a drastic decline since returning home from the hospital 10/31/18. Penni Bombard and Valentina Gu report that patient has children and extended family, however they do not live close by and visit infrequently. Patient's daughter Aldean Jewett is most involved with patient's care. SW spoke to [place] who reports she does have POA and HCPOA in place.  3. PATIENT/CAREGIVER EDUCATION/ COPING:  Gwinda Maine, NP present for this visit and discusses patient's current status. Recommending hospice care in the home. SW provided much education to [place] and [place] regarding hospice in the home. Penni Bombard and Valentina Gu share concerns of state regulations for their facility that prevent them from having patient's with higher level of care needs. SW reviews plan to assist staff/family/patient with seeking SNF placement, however explained this could be a lengthy process. Valentina Gu and Penni Bombard agree that having support in  place should patient continue to decline, would be most beneficial. They agree with hospice services being initiated in the care home, while goal is to work towards SNF placement.  4. PERSONAL EMERGENCY PLAN:  DNR form in place. Penni Bombard reports they will call 911 with any emergencies. 5. COMMUNITY RESOURCES COORDINATION/ HEALTH CARE NAVIGATION:  Plan is for patient to be admitted to hospice services in the family care home while seeking SNF placement. SW has received an updated FL-2 from patient's PCP and spoken with daughter Aldean Jewett regarding facilities of choice. Plan will be to contact [facility], [address], and [facility], all in [place] to inquire about bed availability and fax over requested information. Spoke with [name] regarding the above plan, since she was not present for this visit. She verbalizes agreement with plan and much appreciation.  6. FINANCIAL/LEGAL CONCERNS/INTERVENTIONS:  None at this time     SOCIAL HX:  Social History   Tobacco Use  . Smoking status: Never Smoker  . Smokeless tobacco: Never Used  Substance Use Topics  . Alcohol use: No    CODE STATUS:   Code Status: Not on file  ADVANCED DIRECTIVES: N MOST FORM COMPLETE:  no HOSPICE EDUCATION PROVIDED: Yes. Plan is for admission to hospice services in the family care home.   ZCH:YIFOYDX is bedbound, minimally responsive, drinking 1/2 ensure daily. Total care.       [facility]

## 2018-11-26 ENCOUNTER — Telehealth: Payer: Self-pay

## 2018-11-26 NOTE — Telephone Encounter (Signed)
Trying to route this note to you, got interrupted and messed it all up

## 2018-11-26 NOTE — Telephone Encounter (Signed)
[facility] on [facility] called back, spoke with Su Hilt social worker, working with [name]. She reports that they  Have found a skilled nursing facility in [place] with a female bed, and are just waiting on the facility to accept [name] as a patient. She does not qualify for the hospice home since she is alert and oriented and drinking ensure and eating small amounts.

## 2018-11-26 NOTE — Telephone Encounter (Signed)
Noted, thanks!

## 2018-11-26 NOTE — Telephone Encounter (Signed)
[name] is still not walking, but hospice is handling everything and hopes to have her moved within a couple of days

## 2018-12-03 DIAGNOSIS — R627 Adult failure to thrive: Secondary | ICD-10-CM | POA: Diagnosis not present

## 2018-12-03 DIAGNOSIS — F0390 Unspecified dementia without behavioral disturbance: Secondary | ICD-10-CM | POA: Diagnosis not present

## 2018-12-03 DIAGNOSIS — E46 Unspecified protein-calorie malnutrition: Secondary | ICD-10-CM | POA: Diagnosis not present

## 2018-12-03 DIAGNOSIS — I10 Essential (primary) hypertension: Secondary | ICD-10-CM | POA: Diagnosis not present

## 2018-12-04 DIAGNOSIS — F0390 Unspecified dementia without behavioral disturbance: Secondary | ICD-10-CM | POA: Diagnosis not present

## 2018-12-04 DIAGNOSIS — R627 Adult failure to thrive: Secondary | ICD-10-CM | POA: Diagnosis not present

## 2018-12-09 ENCOUNTER — Telehealth: Payer: Self-pay

## 2018-12-09 NOTE — Telephone Encounter (Signed)
Noted, thanks!

## 2018-12-09 NOTE — Telephone Encounter (Signed)
Spoke with patient's daughter [name], she states that her [place] [facility] [place] [facility] [place]. Ms. [name] is settling in well, and will eat when prompted to. She is being seen by the doctors in that facility as well as hospice nurses that come twice a week. [name] is able to visit daily and is able to be more involved in Mother's care, and both are satisfied with this. Gave well wishes and continued concern for Ms. [name]. Told daughter that I would inform you of this update

## 2018-12-10 DIAGNOSIS — F0390 Unspecified dementia without behavioral disturbance: Secondary | ICD-10-CM | POA: Diagnosis not present

## 2018-12-10 DIAGNOSIS — I10 Essential (primary) hypertension: Secondary | ICD-10-CM | POA: Diagnosis not present

## 2018-12-10 DIAGNOSIS — R627 Adult failure to thrive: Secondary | ICD-10-CM | POA: Diagnosis not present

## 2018-12-10 DIAGNOSIS — E46 Unspecified protein-calorie malnutrition: Secondary | ICD-10-CM | POA: Diagnosis not present

## 2018-12-17 DIAGNOSIS — E46 Unspecified protein-calorie malnutrition: Secondary | ICD-10-CM | POA: Diagnosis not present

## 2018-12-17 DIAGNOSIS — M79605 Pain in left leg: Secondary | ICD-10-CM | POA: Diagnosis not present

## 2018-12-17 DIAGNOSIS — R627 Adult failure to thrive: Secondary | ICD-10-CM | POA: Diagnosis not present

## 2018-12-17 DIAGNOSIS — M79604 Pain in right leg: Secondary | ICD-10-CM | POA: Diagnosis not present

## 2019-01-11 DEATH — deceased

## 2019-03-10 ENCOUNTER — Ambulatory Visit: Payer: Medicaid Other | Admitting: Family Medicine

## 2019-03-11 ENCOUNTER — Ambulatory Visit: Payer: Medicare HMO | Admitting: Family Medicine

## 2019-04-13 ENCOUNTER — Encounter: Payer: Self-pay | Admitting: Family Medicine
